# Patient Record
Sex: Male | Born: 1960 | Race: Black or African American | Hispanic: No | Marital: Single | Smoking: Current every day smoker
Health system: Southern US, Community
[De-identification: ages and names within clinical notes are randomized; demographics above are authoritative.]

## PROBLEM LIST (undated history)

## (undated) DIAGNOSIS — M1990 Unspecified osteoarthritis, unspecified site: Secondary | ICD-10-CM

## (undated) DIAGNOSIS — K219 Gastro-esophageal reflux disease without esophagitis: Secondary | ICD-10-CM

## (undated) HISTORY — PX: TOTAL KNEE ARTHROPLASTY: SHX125

## (undated) HISTORY — PX: LEG SURGERY: SHX1003

## (undated) HISTORY — PX: CARTILAGE SURGERY: SHX1303

## (undated) HISTORY — DX: Gastro-esophageal reflux disease without esophagitis: K21.9

## (undated) HISTORY — DX: Unspecified osteoarthritis, unspecified site: M19.90

## (undated) HISTORY — PX: KNEE SURGERY: SHX244

---

## 1997-10-31 ENCOUNTER — Emergency Department (HOSPITAL_COMMUNITY): Admission: EM | Admit: 1997-10-31 | Discharge: 1997-10-31 | Payer: Self-pay | Admitting: Emergency Medicine

## 1998-06-05 ENCOUNTER — Emergency Department (HOSPITAL_COMMUNITY): Admission: EM | Admit: 1998-06-05 | Discharge: 1998-06-05 | Payer: Self-pay | Admitting: Emergency Medicine

## 1999-06-03 ENCOUNTER — Emergency Department (HOSPITAL_COMMUNITY): Admission: EM | Admit: 1999-06-03 | Discharge: 1999-06-03 | Payer: Self-pay | Admitting: Emergency Medicine

## 1999-06-03 ENCOUNTER — Encounter: Payer: Self-pay | Admitting: Emergency Medicine

## 2000-08-31 ENCOUNTER — Ambulatory Visit: Admission: RE | Admit: 2000-08-31 | Discharge: 2000-08-31 | Payer: Self-pay | Admitting: Orthopedic Surgery

## 2000-08-31 ENCOUNTER — Encounter: Payer: Self-pay | Admitting: Orthopedic Surgery

## 2001-12-02 ENCOUNTER — Emergency Department (HOSPITAL_COMMUNITY): Admission: EM | Admit: 2001-12-02 | Discharge: 2001-12-02 | Payer: Self-pay | Admitting: *Deleted

## 2002-09-21 ENCOUNTER — Emergency Department (HOSPITAL_COMMUNITY): Admission: EM | Admit: 2002-09-21 | Discharge: 2002-09-21 | Payer: Self-pay | Admitting: Emergency Medicine

## 2002-12-08 ENCOUNTER — Emergency Department (HOSPITAL_COMMUNITY): Admission: EM | Admit: 2002-12-08 | Discharge: 2002-12-08 | Payer: Self-pay | Admitting: Emergency Medicine

## 2003-04-14 ENCOUNTER — Inpatient Hospital Stay (HOSPITAL_COMMUNITY): Admission: RE | Admit: 2003-04-14 | Discharge: 2003-04-17 | Payer: Self-pay | Admitting: Orthopedic Surgery

## 2003-12-13 ENCOUNTER — Emergency Department (HOSPITAL_COMMUNITY): Admission: EM | Admit: 2003-12-13 | Discharge: 2003-12-13 | Payer: Self-pay | Admitting: Emergency Medicine

## 2005-02-04 ENCOUNTER — Emergency Department (HOSPITAL_COMMUNITY): Admission: EM | Admit: 2005-02-04 | Discharge: 2005-02-04 | Payer: Self-pay | Admitting: Emergency Medicine

## 2006-05-20 ENCOUNTER — Emergency Department (HOSPITAL_COMMUNITY): Admission: EM | Admit: 2006-05-20 | Discharge: 2006-05-20 | Payer: Self-pay | Admitting: Emergency Medicine

## 2006-12-05 ENCOUNTER — Ambulatory Visit: Admission: RE | Admit: 2006-12-05 | Discharge: 2006-12-06 | Payer: Self-pay | Admitting: Specialist

## 2006-12-19 ENCOUNTER — Encounter (INDEPENDENT_AMBULATORY_CARE_PROVIDER_SITE_OTHER): Payer: Self-pay | Admitting: Specialist

## 2006-12-19 ENCOUNTER — Inpatient Hospital Stay (HOSPITAL_COMMUNITY): Admission: RE | Admit: 2006-12-19 | Discharge: 2006-12-22 | Payer: Self-pay | Admitting: Specialist

## 2007-08-07 ENCOUNTER — Emergency Department (HOSPITAL_COMMUNITY): Admission: EM | Admit: 2007-08-07 | Discharge: 2007-08-07 | Payer: Self-pay | Admitting: Emergency Medicine

## 2010-07-16 ENCOUNTER — Emergency Department (HOSPITAL_COMMUNITY): Payer: Self-pay

## 2010-07-16 ENCOUNTER — Inpatient Hospital Stay (HOSPITAL_COMMUNITY)
Admission: EM | Admit: 2010-07-16 | Discharge: 2010-07-19 | DRG: 907 | Disposition: A | Discharge status: Home / Self Care | Payer: Self-pay | Attending: General Surgery | Admitting: General Surgery

## 2010-07-16 DIAGNOSIS — S7290XA Unspecified fracture of unspecified femur, initial encounter for closed fracture: Secondary | ICD-10-CM | POA: Diagnosis present

## 2010-07-16 DIAGNOSIS — S71009A Unspecified open wound, unspecified hip, initial encounter: Principal | ICD-10-CM | POA: Diagnosis present

## 2010-07-16 DIAGNOSIS — S71109A Unspecified open wound, unspecified thigh, initial encounter: Principal | ICD-10-CM | POA: Diagnosis present

## 2010-07-16 DIAGNOSIS — Y998 Other external cause status: Secondary | ICD-10-CM

## 2010-07-16 DIAGNOSIS — Z1889 Other specified retained foreign body fragments: Secondary | ICD-10-CM

## 2010-07-16 LAB — CBC
HCT: 39.3 % (ref 39.0–52.0)
Hemoglobin: 13 g/dL (ref 13.0–17.0)
MCHC: 33.1 g/dL (ref 30.0–36.0)
RDW: 13.3 % (ref 11.5–15.5)
WBC: 7.7 10*3/uL (ref 4.0–10.5)

## 2010-07-16 LAB — POCT I-STAT, CHEM 8
Chloride: 107 meq/L (ref 96–112)
HCT: 42 % (ref 39.0–52.0)
Hemoglobin: 14.3 g/dL (ref 13.0–17.0)
Potassium: 3.8 meq/L (ref 3.5–5.1)

## 2010-07-16 LAB — PROTIME-INR
INR: 0.98 (ref 0.00–1.49)
Prothrombin Time: 13.2 s (ref 11.6–15.2)

## 2010-07-17 ENCOUNTER — Emergency Department (HOSPITAL_COMMUNITY): Payer: Self-pay

## 2010-07-17 LAB — BASIC METABOLIC PANEL
CO2: 27 mEq/L (ref 19–32)
Chloride: 103 mEq/L (ref 96–112)
Potassium: 3.8 mEq/L (ref 3.5–5.1)

## 2010-07-17 LAB — COMPREHENSIVE METABOLIC PANEL
Albumin: 3.8 g/dL (ref 3.5–5.2)
Alkaline Phosphatase: 87 U/L (ref 39–117)
BUN: 15 mg/dL (ref 6–23)
Calcium: 9.4 mg/dL (ref 8.4–10.5)
Glucose, Bld: 137 mg/dL — ABNORMAL HIGH (ref 70–99)
Potassium: 3.8 meq/L (ref 3.5–5.1)
Sodium: 140 meq/L (ref 135–145)
Total Protein: 7.4 g/dL (ref 6.0–8.3)

## 2010-07-17 LAB — TYPE AND SCREEN: Unit division: 0

## 2010-07-17 LAB — CBC
HCT: 34.1 % — ABNORMAL LOW (ref 39.0–52.0)
Hemoglobin: 11.7 g/dL — ABNORMAL LOW (ref 13.0–17.0)
MCH: 31.2 pg (ref 26.0–34.0)
MCV: 90.9 fL (ref 78.0–100.0)
RBC: 3.75 MIL/uL — ABNORMAL LOW (ref 4.22–5.81)
WBC: 13.1 10*3/uL — ABNORMAL HIGH (ref 4.0–10.5)

## 2010-07-17 LAB — LACTIC ACID, PLASMA: Lactic Acid, Venous: 3.8 mmol/L — ABNORMAL HIGH (ref 0.5–2.2)

## 2010-07-17 LAB — ABO/RH: ABO/RH(D): A POS
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
## 2010-07-20 NOTE — Op Note (Signed)
NAMESHAHZAIN, KIESTER NO.:  [id number]   MEDICAL RECORD NO.:  [id number]          PATIENT TYPE:  INP   LOCATION:  5031                         FACILITY:  [facility]   PHYSICIAN:  Kerrin Champagne, M.D.   DATE OF BIRTH:  June 03, 1960   DATE OF PROCEDURE:  12/19/2006  DATE OF DISCHARGE:                               OPERATIVE REPORT   PREOPERATIVE DIAGNOSIS:  Severe right knee tricompartment  osteoarthritis, post-traumatic.   POSTOPERATIVE DIAGNOSIS:  Severe right knee tricompartment  osteoarthritis, post-traumatic with multiple loose bodies of  osteochondral bone material and the loose osseous bodies within the  anterior aspect of the knee and posterolateral knee.  Severe joint line  erosion involving the medial joint space and five retained smooth pins,  presumably used to fix previous osteochondral injury to the medial  femoral condyle.   PROCEDURE:  Right computer-assisted cemented total knee arthroplasty  using DePuy PFC #5 femoral and tibial components with a 12.5 mm tibial  tray and 38 mm patella component.   SURGEON:  Dr. Vira Browns.   ASSISTANT:  Maud Deed, PAC.   ANESTHESIA:  General via orotracheal intubation, Dr. Krista Blue.   ESTIMATED BLOOD LOSS:  150 mL.   DRAINS:  Hemovac x1, Foley to straight drain.   TOTAL TOURNIQUET TIME:  300 mmHg for 2 hours and 25 minutes.   BRIEF CLINICAL HISTORY:  The patient is a 50 year old male who had  injured his knee as teenager falling down a hill, sustaining laceration,  underwent an arthroscopic surgery with pinning of the medial aspect of  this femur.  He has undergone progressive degeneration of his right  knee.  He has had previous left knee total knee arthroplasty by Dr.  Simonne Come in 2005.  He presents for right total knee arthroplasty after  failing conservative management.  He has enlisted in vocational  rehabilitation.  Hopes are to diminish his pain in regards to his knee  and provided him with better  ability to stand ambulate comfortably.  Intraoperative findings as above.   DESCRIPTION OF PROCEDURE:  After adequate general anesthesia, the  patient had the Foley catheter placed.  Standard preoperative  antibiotics of Ancef.  Prior to the surgery, marking of the expected  surgery knee, the right side with a surgical marking pen while the  patient was awake.  He has had a previous left knee total knee  arthroplasty.  Right knee:  Tourniquet about the upper thigh, standard  prep with DuraPrep solution from the right toes to the right upper  thigh.  He was then draped in the usual manner, iodine Vi-drape was  used.  The patient underwent an initial incision after elevation of the  right lower extremity, exsanguination with Esmarch bandage, tourniquet  was inflated to 300 mmHg.  Incision:  A standard anterior medial  incision in the midportion of the distal quad mechanism, then just  slightly medial to the midportion of the patella and along the medial  aspect of the patella tendon and anterior tibial tubercle through skin  and subcutaneous layers directly down to the peritenon layer and this  was incised  in line with the skin incision and continued along the  medial aspect of the patella tendon distally.  Peritenon elevated both  medial and lateral in order for identification and closure at the end of  the case.  Incision then made into the quad mechanism and extensor  mechanism, superior medial attachment to the patella and then continued  distally and medially about medial aspect of patella preserving a cuff  for reattachment to the patella medially and then extended along the  medial aspect of the patellar tendon directly down to bone through the  prepatellar fat pad medially.  The synovium was incised throughout its  extent along the medial incision in line with the previous incisions.  An incision proximally was continued upwards into the quadriceps tendon  medial one-third and  continued longitudinally upwards over an area of  about 10 to 11 cm above this proximal or superior pole of patella.  Following this then, the patella was everted and the knee flexed.  Leksell rongeurs were then used to debride osteophytes circumferentially  about the femoral condyles both medial lateral and over the superior  aspect of the femoral condyles anteriorly.  Incision was made into the  fat pad and subcutaneous tissues superior to the anterior aspect of the  femoral condyles proximally in order to expose the distal portions and  the anterior portion of the femoral shaft for later registration points  for cuts in the coronal plane.  Following this then, the posterior and  anterior cruciate ligaments were debrided.  The patient had exposure of  both medial aspect of proximal tibia and tibia with takedown of the pes  anserine as well as the insertion of the medial collateral ligament  medially.  This was continued posteriorly with release of the posterior  medial corner performed.  The medial meniscus was debrided using  electrocautery at the meniscocapsular junction anteriorly and medially  as best as possible.  The lateral meniscus was also excised over its  anterior portion as well as the posterior half of the retropatellar  tendon fat pad was incised.  Following this then, the patient had had  previous radiographs of his knees in the preop holding area.  He had  removal of multiple pins.  These were first localized.  Two of the pins  were quite apparent readily at the end surface of the medial femoral  condyle near the femoral notch.  These were then carefully debrided  circumferentially in bone using curettage and then removed using a small  needle-nose pliers and regular pliers.  Additional pin was then found  anterior and proximal to these pins just over the distal medial aspect  of the trochlea of the femur.  This was found by debriding cartilage  here and identifying the  cut end of the smooth pin.  Then again  debriding circumferentially about it and then grasping with the pair of  pliers and then removing.  Final pin was found just posterior on the  weightbearing surface of the medial femoral condyle closer to the  intercondylar notch and this was removed after first using osteotomes to  shave bone in order to identify its presence and then circumferentially  about the pin debriding bone material so the pin could be grasped using  a pair of pliers.  Once this was completed then, the patient had the  knee flexed further and further debridement of the intercondylar notch  was carried out of osteophytes.  The patient's leg was then brought  into  full extension and the Schanz screws were placed over the distal medial  femoral metaphysis.  The first pin was placed superior and medial aspect  of the femoral metaphysis anteriorly and then second pin place using the  pin guide.  These obtained cortical purchase both superficial and deep.  The Y array for a computer guided system was then placed on the distal  femur.  Both pins were placed within the incision line.  Distally, stab  incisions were made for the expected placement of the patient's pins and  these were approximately 15-20 cm distal to the patient's knee joint  line.  First stab incision was made in then Schanz screw passed through  the superficial medial tibial cortex obliquely engaging the deep cortex.  Then using the pin guide, a second stab incision was made and this was  used to place a second Schanz screw, again obtaining cortical purchase  in the superficial cortex and putting the deep cortex and engaging it.  The T aligned array was then placed with the tibia for communication  with the computer.  These were carefully aligned and determined in both  flexion/extension to be good position and alignment and were therefore  tightened into place using a pair of pliers.  This completed  registration  was carried out first of the right hip axis and this was  done by careful circumduction of the right hip obtaining registration of  sufficient points and computer was satisfied.  Then using the localizer,  the array device both medial and lateral malleoli were identified.  Anterior surface of the proximal tibia and both the expected medial and  lateral aspects of the proximal tibia were identified, anterior and  posterior points, degree of rotation of the tibia also identified and  registration of points within the medial tibial plateau, anterior  proximal surface of the tibia and lateral tibial plateau were performed.  Computer then developed a model for the tibia and this was then verified  checking numerous points about the proximal tibia, both medial and  lateral plateaus as well as anterior joint surface.  This completed  then, the registration then and the determination of tibial computer  model.  Next registration points for the femur were performed, first  identifying the anterior portion of the notch distal femur.  Then  determining the anterior surface of the distal femur just above the  femoral condyles.  Then both medial and lateral epicondyles were  identified.  The registration was then carried out following the  termination of Whiteside's line by of obtaining registration of the  medial femoral condyle, its most distal portions, extending posteriorly.  Then the lateral femoral condyle registration was carried out.  Registration was then carried out over the anterior distal femur and  checking the anterior bow was performed as well.  Both found to be zero.  With this then registration points had been obtained to then allow the  computer to model the distal femur and this was done without difficulty.  The tibia appeared to be a size 6 or 5.  The initial evaluation of the  knee determine the knee to be in 10 degrees of flexion and 11.6 degrees  of varus.  A significant degree  of deformity related to medial joint  line erosion and flexion deformity.  With this then determination of the  expected cut for the tibia performed a nearly 12 mm cut was necessary in  order to remove 2 mm of bone  from the medial tibial plateau surface  beneath the previous eroded surface here.  Note that osteophytes were  resected circumferentially about the proximal tibia.  The tibia was able  to be slightly subluxed protecting the soft tissue structures then the  proximal tibial cutting guide with the verification array attached was  then carefully placed and attached to the proximal tibia within 1 degree  of varus-valgus and flexion/extension and 1 mm of accuracy both medially  and laterally.  This was pinned in place using two pins medial and  lateral as well as a cross-pin medially.  With this then, the cut  surface of the proximal tibia was carried out protecting the soft tissue  structures both posteriorly, medially and laterally.  Note that several  large osteophytes were removed from the anterior aspect of the knee  joint with the initial entry into the knee joint.  At least three in  number were resected and these measured approximately 8-10 mm in  diameter each.  The proximal tibial cut was then performed using  oscillating saw protecting the patellar tendon, medial and lateral  collateral ligaments.  The proximal portion of the tibia was cut, was  then grasped and then soft tissue attachments incised and removed as a  single fragment.  The residual cruciate ligaments were then resected  posteriorly.  Any residual medial and lateral meniscal remnants were  removed as well using knives and debriding out the meniscocapsular  junction.  Next, extension and flexion gaps were measured and based on  these measurements, an expected distal femoral cut of about 12.6 mm was  determined, however, an 11.6 mm cut was chosen and a movement of the  expected position of the distal femoral  coronal and chamfer cut jig  anteriorly an additional 2 mm provided for a normalization and  realignment of the extension and flexion gaps to a more balanced knee.  With that being the case, then the patient had a #5 femoral component  chosen.  This based on the size of the component against the end of the  femur and visualization of this.  A #6 appeared to be too large for the  osteophytes still present medial and lateral.  With the T5 chosen, then  the cutting jig for the distal femoral transfer cut was then carefully  placed using the verification array and the computer guidance.  These  were pinned into place using two pins medial and lateral and a cross-  pin.  A distal cut was then performed using oscillating saw protecting  soft tissue structures medial and lateral.  This was then verified and  further trimmed until it was within 1 mm cut as well as within 1 degree  of varus valgus flexion extension.  This completed then, the jig for the  distal femoral cut was removed as were the proximal tibial pins.  The  patient had further soft tissue release and check of both the extension  and flexion gap was performed and these were determined to be in good  position and alignment.  That completed, then the tibia was subluxed,  McHales were used for this purpose.  The patient had further resection  of osteophytes off the posterior aspect of the femoral condyles both  medial and lateral using a 1-inch curved osteotome.  Large Crego to  protect soft tissue structures here.  Very large osteophytes off the  posterior aspect of the lateral proximal tibia were resected further and  the pinned over the  posterior aspect of the proximal tibia was  identified on the cut surface of the proximal tibia removed.  Next the  tibial tray was placed.  A #5 tray was placed onto the cut surface of  the proximal tibia, pinned into place.  The upright for the reamer for  the tibial keel was then impacted into  place.  The reamer for reaming  for the keel was then reverse spinned and was used to then ream for the  keel itself.  This was then removed.  The winged flange for the keel  along with the keel insertion device also was then used and impacted  into place through the tray proximally.  This being completed, then  attention was turned to the femoral distal cut surfaces where both coronal cuts and chamfer cuts had been performed.  Chamfer cuts  performed after performing both coronal anterior cut protecting soft  tissue structures and the posterior condyle coronal cuts using Crego to  protect soft tissues here.  The cutting jig was removed appropriately as  well.  A jig used for cutting of the box for the distal portion of the  femur was then placed through the end of the cut surface of the femur  and aligned with the anterior cut surface as well.  Pinned with three  pins.  This in place, then an oscillating saw was then used to cut the  transverse cut at the very top the notch.  The small saw was left in  place then using a larger oscillating saw, cuts were made then the  sagittal plane removing the sagittal bone for the femoral notch  distally.  Care was taken to carefully file the ends of the bones here  to smooth this. This cutting block was then removed.  The trial femur  was inserted and packed into place without difficulty.  Provided  excellent fit.  Trial reduction first a 10 mm then the 12.5 provided  excellent fit with excellent varus and valgus stability, flexion to over  120 degrees, nearly 135 degrees obtained.  The shuck test demonstrated  no evidence of shucking of the right knee with this component.  This was  chosen for the right total knee arthroplasty.  The cutting jig for the  coronal cut for the posterior aspect of the patella was then brought  onto the field with the patella measured at 27 mm with a 10 mm resection  and 17 mm patella remaining anteriorly.  The cutting  jig was then  carefully adjusted to allow for 17 mm remaining then applied to the  patella.  Oscillating saw was then passed through the cutting jig used  to cut the patella in the coronal plane.  Next, a 38 mm patella was  chosen based on size of the cut surface of patella.  This provided the  best bit.  It was medialized slightly and impacted into place and then  drill holes were placed for the expected pegs for the posterior patellar  resurfacing.  Next, then irrigation was carried out.  Care was taken to  ensure that all osteophytes had been resected.  The knee had been placed  through a full range of motion with full extension and full flexion,  stable varus and valgus stress, no tendencies to patella dislocation nor  any tendencies for lift-off of the proximal tibial component.  All the  trial components were then removed and the permanent prosthesis brought  onto the field, both the distal  femur and proximal tibia with its keel  as well as the retropatellar prosthesis.  Right knee was then irrigated  with copious amounts of irrigant solution using pulse lavage system.  Then new drapes were applied.  Cement was then mixed to its proper  consistency.  After drying of the proximal tibia, it was applied to the  keel hole as well as the proximal cut surface of the tibia in a thin  layer.  Additional cement was placed over the deep surface of the tibia.  The tibia component was then placed over the proximal tibia and impacted  into place.  Excess cement circumferentially was resected and removed  without difficulty.  Next the distal cut surface of the femur was then  coated with cement.  Note that the lug holes were drilled through the  trial prosthesis holes available for the lug nut drills.  Cement was  then applied to the cut surfaces and the holes for the distal femur.  Small amount of the notch over the anterior surface of the distal tibia.  Additional cement was placed over the  posterior runners of the femoral  component.  It was then placed over the distal tibia, engaging the  distal lug holes with lugs.  Then impacted into place.  Excellent  position and alignment was obtained.  Excess cement was removed using  Personal assistant provided.  This completed, then the trial 12.5-mm  implant was placed into the tibial keel, leg brought into full  extension.  Patella component was then coated with cement over its  posterior aspect.  Additional cement was placed over the retropatellar  surface following drying of this area and debridement of the holes for  the pegs of the anterior aspect the time of the patella component.  Following application of cement then a Glorious Peach was used to note the hole  positions.  Component was brought down to the retropatellar surface and  pressed into place.  Excess cement was removed and then ring compressing  tong was then used to carefully apply pressure to the patella, allowing  for cement then to be squeezed out from the surface of the component  with bone.  Excess cement removed using first a knife and then [employer].  With this, then leg was irrigated.  Tourniquet was released.  Tourniquet time was 2 hours and 25 minutes.  Following released then  hemostasis obtained in the superficial layers of skin and subcu and then  examining for geniculate vessel bleeding.  These were cauterized both  medial and lateral and also over the central perforating vessels  posteriorly.  This completed, trial reduction was performed using both a  10 mm and 12.5 mm components.  The 12.5 mm provided the best  medial  varus and valgus stress stability as well as decreased shuck sign.  The  knee could be brought into full extension, hyperextension 2-3 degrees  and into varus neutral alignment 1 degree of varus.  With maximum of 4  degrees of varus without stressing medial joint line.  Once the cement  had completely hardened, irrigation was performed of  the knee.  Knee was  hyperflexed and the tibial tray was removed, the trial trays.  Examination for excess cement, this demonstrated some small amounts over  the posterior aspect of the tibial tray and about the femoral component  medial and lateral, these were removed using osteotomes.  Small amount  about the patella surface as well removed with a quarter inch  osteotome.  Irrigation performed.  The permanent tibial tray was then inserted  carefully retracting subcu and soft tissues away from the proximal tibia  and using McHales provided as well as the small Homan to retract soft  tissue.  This was done without difficulty.  The knee was then reduced  and the patella component engaged the distal femur femoral trochlea.  The knee was placed through a full range of motion, full extension and  indeed hyperextension of about 3 degrees, flexion to 135 degrees.  The  knee was stable to varus and valgus stress, no significant shuck noted  on the anterior drawer.  This completed, then a medium Hemovac drain was  placed in the depth of the incision exiting over the anterior lateral  aspect of the distal thigh.  This just short of the joint surface line.  Knee brought into full extension and there was no further active  bleeding present.  Hemostasis had been complete and then the synovium  was approximated with interrupted 0 Vicryl sutures.  The extensor  mechanism of the distal thigh approximated with interrupted #1 Vicryl  sutures along with the insertion into the patella and superior medial  corner with #1 Vicryl sutures along the medial aspect of patella and along the medial aspect of the patellar tendon.  The peritenon was then  approximated with interrupted 0 Vicryl sutures, providing one tight  closure.  Subcutaneous layers approximated properly with interrupted 0  Vicryl sutures and distally with interrupted 2-0 Vicryl sutures.  The  skin closed with a running subcu stitch of 4-0 Vicryl.   Tincture of  benzoin and Steri-Strips applied, 4x4s, ABD pad fixed to the skin with  Kerlix, then Ace wrap applied from right foot to upper thigh, knee  immobilizer was applied.  The patient was then reactivated, extubated,  returned to the recovery room in satisfactory condition.      Kerrin Champagne, M.D.  Electronically Signed     JEN/MEDQ  D:  12/19/2006  T:  12/20/2006  Job:  [id number]

## 2010-07-23 NOTE — H&P (Signed)
[place]  Patient:    Derek Duran, Derek Duran                     MRN: [id number] Adm. Date:  09/05/00 Attending:  Fayrene Fearing P. Aplington, M.D. Dictator:   Colleen P. Mahar, P.A.                         History and Physical  DATE OF BIRTH:  May 25, 1960  ADDENDUM  On August 29, 2000 I spoke with Dr. Michelle Piper in the Anesthesia Department at [facility] regarding this patient and his history of daily crack and marijuana abuse.  Dr. Michelle Piper indicated that as long as the patient was able to stop using these drugs on the date he indicated that it should not be a problem with anesthesia and they would be willing to do the surgery.  I had spoken with the patient during his history and physical on August 29, 1999, Monday, regarding discontinuing any abuse of these drugs at least through the date of surgery and during his recovery from surgery and he indicated that he would stop as of Monday, August 28, 2000 using any drugs.  The patient also indicated that he would be interested in trying to get into some sort of a rehabilitation program after the surgery in order to keep himself off drugs postoperatively and throughout his recovery process. DD:  08/30/00 TD:  08/30/00 Job: [id number]

## 2010-07-23 NOTE — Op Note (Signed)
NAME:  Derek Duran, Derek Duran                      ACCOUNT NO.:  [id number]   MEDICAL RECORD NO.:  [id number]                   PATIENT TYPE:  INP   LOCATION:  X009                                 FACILITY:  [facility]   PHYSICIAN:  Marlowe Kays, M.D.               DATE OF BIRTH:  October 23, 1960   DATE OF PROCEDURE:  04/14/2003  DATE OF DISCHARGE:                                 OPERATIVE REPORT   PREOPERATIVE DIAGNOSIS:  Osteoarthritis, left knee.   POSTOPERATIVE DIAGNOSIS:  Osteoarthritis, left knee.   OPERATION:  Osteonics total knee replacement, left.   SURGEON:  Illene Labrador. Aplington, M.D.   ASSISTANT:  Georges Lynch. Darrelyn Hillock, M.D.   ANESTHESIA:  General.   PATHOLOGY AND JUSTIFICATION FOR PROCEDURE:  Long history of bilateral knee  problems and surgery.  Both knees have advanced osteoarthritis.  In the left  knee, is large loose spot in the anterior knee, taking origin from the  lateral femoral condyle and tricompartmental arthritic changes.  This one is  more symptomatic than the right, and hence we are doing this one first.   PROCEDURE:  Prophylactic antibiotics, satisfactory general anesthesia,  pneumatic tourniquet, Surefoot, lateral hip positioner.  Left leg was  prepped with DuraPrep, draped in a sterile field, Ioban employed, this  marked out sterilely.  Vertical incision down to the patella mechanism with  median parapatellar incision to open the joint.  The patient's and medial  collateral ligament were freed up over the proximal tibia.  Patellar  mechanism was freed up, patella everted, and knee flexed.  Osteophytes from  around the femur and the patella were removed.  Remnants of the ACL, the  loose body, and the anterior portions of the both menisci were removed.  I  made a drill in the distal femur, followed by the axis liner set for 5  degrees for the let knee and made a 10 mm distal femoral cut since he did  not have a flexion contracture, and his knee was so tight that I  was unable  to place the cradle for sizing the distal femur without flattening out the  distal femur on initial leveling cut which we would normally make later.  After making the leveling cut, we were able to size the distal femur to 9.  Scribe lines were placed on the distal femur, and then we used the distal  femoral cutting jig to make anterior and posterior cuts of posterior and  anterior chamferings.  Then used a micro saw to further remove bone in the  intercondylar notch since he had such hard bone to minimize risk of cracking  the bone during the impaction technique.  I then used the jig for cutting  the patellar groove followed by the two jigs for creating the space for the  tibial post.  Following this, I returned to the tibia where we sized him at  a 9 and placed the baseplate using  both initial drill and step-cut drill.  Then using intramedullary rod and external cutting jig set for 0-degree cut,  I made a 4 mm cut based off the compressed medial tibial plateau.  Then used  the lamina spreader and removed remnants of bone from underneath the femoral  condyles. The lateral was deficient and had articular cartilage present  which I curetted off down to raw bone.  Then went through a trial reduction,  found the 10 mm spacer seemed to fit nicely.  With the knee in extension, we  then used the 10 mm recessed cutting guide for a 28 mm cut.  Trial  prosthesis was then placed and excess bone trimmed up from around the  perimeter.  We then took the knee through a full range of motion and using  the external rod, splitting the bimalleolar distance, positioned the tibial  tray in the ideal position and marked it at the scribe lines at that  position.  With the knee flexed, we then used the tripod apparatus to ream  for the tibial keel up to a #9 cemented.  The knee was then waterpicked.  Gelfoam was placed in the femoral canal as a spacer, and methacrylate was  placed first on the tibia,  followed by the tibia component, impacting it  tightly.  We then placed it on the femur, building up the slight area of the  posterior femur where he had bone deficiency.  The major portion of the  prosthesis fit on good solid foundation bone.  Then with the knee in  extension, after impacting and removing excess methacrylate around the  femur, we inserted our patellar component and held it with a clamp with  excess methyl methacrylate being removed from there as well.  When the  methacrylate had hardened, we checked and removed small amounts of  methacrylate throughout the knee and went through another trial reduction  and found that a 10 posterior stabilized insert was the ideal.  The final  flex 10 mm insert was then placed and the knee taken through a full range of  motion with excellent stability.  There was no patella release required.  A  Hemovac was placed and the wound closed with interrupted #1 Vicryl in the  quadriceps and two layers and distally in two layers in the synovium and  capsule, subcutaneous tissue with a combination of 0 and 2-0 Vicryl, skin  with staples.  Betadine, Adaptic dry sterile dressing were applied.  He was  placed in knee immobilizer and taken to the recovery room in satisfactory  condition.  Tourniquet time was 2 hours.  No blood loss.  No blood  replacement.                                               Marlowe Kays, M.D.    JA/MEDQ  D:  04/14/2003  T:  04/14/2003  Job:  [id number]

## 2010-07-23 NOTE — Discharge Summary (Signed)
Derek Duran NO.:  [id number]   MEDICAL RECORD NO.:  [id number]          PATIENT TYPE:  INP   LOCATION:  5031                         FACILITY:  [facility]   PHYSICIAN:  Derek Duran, M.D.   DATE OF BIRTH:  29-Aug-1960   DATE OF ADMISSION:  12/19/2006  DATE OF DISCHARGE:  12/22/2006                               DISCHARGE SUMMARY   ADMISSION DIAGNOSES:  1. Severe osteoarthritis of the right knee.  2. History of drug abuse.   DISCHARGE DIAGNOSIS:  1. Severe osteoarthritis of the right knee.  2. History of drug abuse.  3. Posthemorrhagic anemia.   PROCEDURE:  On December 19, 2006, the patient underwent right computer-  assisted cemented total knee arthroplasty performed by Dr. Otelia Duran,  assisted by Derek Duran, [facility], under general anesthesia.   CONSULTATIONS:  None.   BRIEF HISTORY:  Patient is a 51 year old male who sustained an injury to  his right knee in his early teens.  He underwent arthroscopic surgery  with pinning of the medial aspect of the femur.  Through the years, he  has progressed to have degeneration of the right knee joint.  He has had  a previous knee replacement on the left in 2005.  He did well following  this procedure.  Now he would like to have the right knee replaced as he  is failing conservative management.  Radiographs have shown severe right  knee tricompartmental osteoarthritis with severe joint line erosion  involving the medial joint space and several loose osseous bodies within  the knee.  It was felt he would benefit from a knee replacement and was  admitted for the procedure as stated above.   BRIEF HOSPITAL COURSE:  The patient tolerated the procedure under  general anesthesia without complications.  He was placed on Coumadin  postoperatively for DVT and PE prophylaxes.  Adjustments in Coumadin  made according to daily pro times by the pharmacist at [facility]. At  discharge, the patient's INR was therapeutic.  The  patient was treated  with PCA analgesics initially and weaned to p.o. analgesics without  difficulty.  The patient's Hemovac drain was discontinued through the  evening hours.  Wound was checked daily thereafter.  He was noted to  have good healing of his wound without drainage.   The patient developed postoperative anemia not requiring blood  transfusion.  Hemoglobin and hematocrit were stable at discharge at 10.4  and 30.0, respectively.  The patient received physical therapy for  ambulation and gait training.  He was able to ambulate as much as 300  feet during the hospital stay.  He was allowed weightbearing as  tolerated on the operative extremity.  He was also taught range of  motion and strengthening exercises which were done daily.  CPM was  utilized during the hospital stay.  The patient was able to resume a  regular diet.  He was voiding well after his catheter was discontinued.  The patient received occupational therapy for ADLs.  He was stable for  discharge on December 22, 2006.   PERTINENT LABORATORY VALUES:  CBC on admission:  Hemoglobin 12.5,  hematocrit 36.1.  At discharge, hemoglobin and hematocrit stable at 10.4  and 30.0, respectively.  At discharge, INR 1.4.  Chemistry studies on  admission within normal limits and repeat postoperatively with values  normal as well.  Urinalysis on admission negative for urinary tract  infection.  Urine drug screen also negative.   PLAN:  The patient was discharged home with arrangements made for home  health physical therapy and occupational therapy.  He will continue with  ambulation and gait training, range of motion strengthening and  stretching exercises.  Daily dressing change at home.   1. He will continue on Coumadin; 7.5 mg was his discharge dose with      arrangements for home labs to be performed.  2. He was given OxyContin 20 mg one every 12 hours.  3. Percocet 5/325 one to two every 4-6 hours as needed for       breakthrough pain.  4. Robaxin 500 mg one every 8 hours as needed for spasm.   He will continue on a regular diet.  Follow up with Dr. Otelia Duran 2 weeks  from the date of surgery.  All questions encouraged and answered.   CONDITION ON DISCHARGE:  Stable.      Derek Duran, P.A.      Derek Duran, M.D.  Electronically Signed    SMV/MEDQ  D:  03/13/2007  T:  03/13/2007  Job:  [id number]

## 2010-07-23 NOTE — H&P (Signed)
[place]  Patient:    Derek Duran, Derek Duran                     MRN: [id number] Adm. Date:  09/05/00 Attending:  Fayrene Fearing P. Aplington, M.D. Dictator:   Colleen P. Mahar, P.A.-C.                         History and Physical  DATE OF BIRTH:  09/13/1960  CHIEF COMPLAINT:  Right knee pain.  HISTORY OF PRESENT ILLNESS:  The patient has had a long history of problems with his right knee.  This began he said over 20 years ago with some injuries secondary to an accident as well as playing sports.  He has had a number of surgeries.  It is unsure exactly what he has had done.  He thinks he might have had an ACL repair in the past and some other surgeries to that right knee.  He has had an increase in pain and disability over the past few years and now is significantly limited secondary to pain.  He has difficulty with ambulation and activities of daily living.  He has tried conservative management and failed that.  His knee is to the point that it is interfering with activities of daily living and the best option for him at this point is a total knee replacement.  PAST MEDICAL HISTORY:  The patient stated that he is healthy and does not have any medical conditions that he is followed for.  PAST SURGICAL HISTORY:  He has had three surgeries on his right knee.  He is unable to be more specific than that as to what was done with those surgeries.  ALLERGIES:  No known drug allergies.  MEDICATIONS:  Dextra 10 mg one q.d.  PRIMARY CARE PRACTITIONER:  He does not have one.  He does not ever get sick he said.  SOCIAL HISTORY:  He smokes approximately 1/2 pack of cigarettes per day.  He does drink alcohol and drinks approximately six drinks per day stating that he will drink beer, wine, liquor or whatever he has.  He does admit to using crack on a regular basis.  He says this is almost daily.  He almost admits to using marijuana on a daily basis.  He lives in  [place] with his girlfriend of 26 years and he also has a 64 year old daughter who lives at home with him.  FAMILY MEDICAL HISTORY:  His parents are both still alive and in good health to the best of his knowledge.  He thinks his father may have hypertension.  REVIEW OF SYSTEMS:  The patient denies fever, chills, night sweats or bleeding tendencies.  He denies blurred vision, double vision, seizures, headaches or paralysis.  Denies shortness of breath, productive cough or hemoptysis. Denies chest pain, angina, orthopnea or claudications.  Denies nausea, vomiting, constipation, diarrhea, melena or bloody stool.  Denies dysuria, hematuria or discharge.  Denies extremity paresthesia, numbness or weakness.  PHYSICAL EXAMINATION:  VITAL SIGNS:  Blood pressure 110/68, respirations are 14 and unlabored. Pulse is 76 and regular.  GENERAL:  This is a 50 year old African-American male who is alert and oriented in no acute distress.  He is pleasant and cooperative to examination.  HEENT:  Head is normocephalic, atraumatic.  Pupils are equal, round and reactive to light and accommodation.  Extraocular movements are intact.  NECK:  Supple to palpation.  No bruits appreciated.  No thyromegaly  noted.  CHEST:  Clear to auscultation bilaterally anterior and posterior.  No rales, rhonchi, wheezes, stridor or friction rubs appreciated.  BREASTS:  Not pertinent and not examined.  HEART:  Regular S1 and S2, regular rate and rhythm.  No murmurs, gallops or rubs appreciated.  ABDOMEN:  Positive bowel sounds throughout.  Soft and supple to palpation. Nondistended and nontender.  No organomegaly is noted.  GENITOURINARY:  Not pertinent and not examined.  EXTREMITIES:  There is significant thigh atrophy on his right leg and he has difficulty extending his right knee with a 10-15 degree short of full extension.  SKIN:  Intact without rashes or lesions.  X-RAYS:  X-rays from [facility]  back in 2001 revealed multiple pins in the patients femur and one in the proximal tibia.  On the lateral, there is one pin in the posterior recess of the tibia though suspected to come out of the femur and lodged in that location.  There is also large loose body between the femur and tibia anteriorly.  IMPRESSION: 1. Right knee osteoarthritis. 2. 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  PLAN: 1. Admit to [facility] on 09/05/00 for right total knee    replacement by Dr. Marlowe Kays. 2. I spoke with Dr. Simonne Come regarding this patient and his substance    abuse.  The patient indicated that he would stop using both crack and    marijuana from this point forward at least until surgical date.  With    this information Dr. Simonne Come said that if anesthesia is comfortable    during the procedure, that he will go ahead and do the procedure. 3. I did speak with the patient regarding rehabilitation and if he would    be interested in going into a rehabilitation program to help him with his    substance abuse.  He demonstrated that he was eager to discontinue using    drugs and that he would be very interested in a rehabilitation program    either prior or after surgery if surgery does go ahead as scheduled    next week.  We can make the social workers aware of his situation and    see if they can put him in touch with the proper people to get this    type help. DD:  08/29/00 TD:  08/29/00 Job: [id number]

## 2010-07-23 NOTE — H&P (Signed)
NAME:  Derek Duran, Derek Duran                      ACCOUNT NO.:  [id number]   MEDICAL RECORD NO.:  [id number]                   PATIENT TYPE:  INP   LOCATION:  NA                                   FACILITY:  [facility]   PHYSICIAN:  Marlowe Kays, M.D.               DATE OF BIRTH:  1960-04-13   DATE OF ADMISSION:  DATE OF DISCHARGE:                                HISTORY & PHYSICAL   CHIEF COMPLAINT:  Pain in my knees, more so on the left than on the right.   HISTORY OF PRESENT ILLNESS:  This 50 year old black male has been seen by [name]  pertaining to progressive problems concerning degenerative changes in both  knees.  He has had problems with both of his knees all of his life,  recalling surgeries and injuries back to his teenage years.  He has been  scheduled for surgeries in the past; however, he was having extreme  difficulty staying away from illicit substances.  He now states that he is  straight.  He is anxious to go ahead with the surgery.  The patient has  had recent dental work, and the majority of the problems with his teeth have  been taken care of.  We will certainly use perioperative antibiotics during  this patient's surgical procedure.  X-rays of his knees show bone-on-bone  abutment medially and a large fragment in the joint on the left knee.  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  He has attempt to continue  with his work; however, both of his knees swell considerably and has  difficulty standing for long periods of time, which his work requires.  After much consideration and discussion of the postoperative problems that  can occur with surgery, the patient has agreed to go ahead with total knee  replacement and arthroplasty of the left knee.   PAST MEDICAL HISTORY:  Significant only for the surgeries on both of his  knees in the past.  Surgeries were done in 1978, 1980, and a knee scope in  1981.  Patient does have a history of drug and alcohol abuse in  the past;  however, as mentioned above, he is clean.   SOCIAL HISTORY:  Patient is single.  He is a Scientist, research (medical).  He smokes 10-15  cigarettes a day.  He has occasional intake of ETOH at this time.  He has  one child.  His niece and girlfriend will be caregivers after surgery.  We  plan to use [facility] health.   FAMILY HISTORY:  Noncontributory.   REVIEW OF SYSTEMS:  CNS:  No seizure disorder problems, numbness, double  vision.  RESPIRATORY:  No productive cough.  No hemoptysis.  No shortness of  breath.  CARDIOVASCULAR:  No chest pain.  No angina.  No orthopnea.  GASTROINTESTINAL:  No nausea, vomiting, melena, bloody stools.  GENITOURINARY:  No discharge, dysuria, or hematuria.  MUSCULOSKELETAL:  Primarily in present illness.   PHYSICAL EXAMINATION:  VITAL SIGNS:  Blood pressure 124/78, pulse 80,  respirations 12.  GENERAL:  He is a alert, cooperative, and friendly 50 year old black male.  HEENT:  Normocephalic and atraumatic.  PERRLA.  EOMs are intact.  Oropharynx  is clear with fair dental hygiene.  NECK:  Supple.  No lymphadenopathy.  LUNGS:  Clear to auscultation.  No rales or rhonchi.  HEART:  Regular rate and rhythm.  No murmurs are heard.  ABDOMEN:  Soft and nontender.  Liver and spleen not felt.  RECTAL/GENITOURINARY:  Not done.  EXTREMITIES:  Patient has a mild amount of effusion of the left knee with  painful range of motion.   ADMISSION DIAGNOSES:  1. Severe, degenerative changes to both knees, more so the left than on the     right.  2. History of substance abuse.   PLAN:  Patient will undergo total knee replacement with arthroplasty of the  left knee with home health after surgery.  He does not have a family  physician.  He is admonished to not take the niacin that he has been taking  to cleanse his blood.  Also to avoid alcohol or any other controlled  substances.     Dooley L. Cherlynn June.                 Marlowe Kays, M.D.    DLU/MEDQ   D:  04/08/2003  T:  04/08/2003  Job:  [id number]

## 2010-07-23 NOTE — Discharge Summary (Signed)
NAME:  Derek Duran, Derek Duran                      ACCOUNT NO.:  [id number]   MEDICAL RECORD NO.:  [id number]                   PATIENT TYPE:  INP   LOCATION:  0478                                 [facility]ACILITY[facility]   PHYSICIAN:  Marlowe Kays, M.D.               DATE OF BIRTH:  11/30/1960   DATE OF ADMISSION:  04/14/2003  DATE OF DISCHARGE:  04/17/2003                                 DISCHARGE SUMMARY   ADMISSION DIAGNOSES:  Severe degenerative arthritis of the left knee.   DISCHARGE DIAGNOSES:  1. Severe degenerative arthritis of the left knee.  2. Mild postoperative anemia.   OPERATION:  On April 14, 2003, the patient underwent [employer]onics total knee  replacement arthroplasty of the left knee, Dr. Ranee Gosselin assisted.   BRIEF HISTORY:  This 50 year old black male has progressive problems  concerning osteoarthritis in both his knees more so on the left than the  right.  He has had a severe amount of trauma throughout his lifetime to this  knee on the left and has had knee scope in the past.  He is a cook at [employer] and after standing for long periods of time he finds that his  pain and discomfort markedly interfering with both his professional and  personal life. After x-rays showing degenerative changes and after much  discussion, it was decided he would benefit from surgical intervention and  was admitted for the above procedure.   HOSPITAL COURSE:  The patient tolerated the surgical procedure quite well.  He entered eagerly into the total knee protocol.  We were able to wean him  from PCA to p.o. medications.  Neurovascularly intact to the left lower  extremity, the wound was dry.  We emphasized extension as well as flexion of  the knee and he achieved 55 degrees on CPM machine. He was ambulating in the  hall doing quite well, afebrile and he had some mild anemia with a  hemoglobin of 9.2, hematocrit of 27.0.  He was asymptomatic.  He was very  eager to go home  the day of discharge, wound was inspected and was dry,  neurovascularly intact to the left lower extremity.  Home health with  [facility] was arranged and so we decided to go ahead and discharge him home to  return to see Marlowe Kays, M.D. in approximately two weeks.   Laboratory values in the hospital hematologically showed a preoperative CBC  completely within normal limits as well as normal electrolytes and  urinalysis negative. Final hemoglobin was 9.2.   CONDITION ON DISCHARGE:  Improved, stable.   PLAN:  The patient is discharged to his home to continue his home  medications and diet. He is to have home health with Genevieve Norlander progressing  with range of motion to his left knee.  He may shower the fourth day after  surgery, return to see Dr. Simonne Come two weeks after date of surgery.  DISCHARGE MEDICATIONS:  1. Percocet 5/325, #50, 1-2 q. 4-6h. p.r.n. pain.  2. Robaxin 500 mg, #30 with 2 refills, 1 q. 6h. p.r.n. muscle spasm.  3. Trinsicon #60, 1 b.i.d.  4. Coumadin per pharmacy.   He is to call if any problems.     Dooley L. Cherlynn June.                 Marlowe Kays, M.D.    DLU/MEDQ  D:  04/17/2003  T:  04/17/2003  Job:  [id number]

## 2010-08-12 ENCOUNTER — Ambulatory Visit: Payer: Self-pay | Attending: Orthopaedic Surgery | Admitting: Rehabilitative and Restorative Service Providers"

## 2010-08-12 DIAGNOSIS — R262 Difficulty in walking, not elsewhere classified: Secondary | ICD-10-CM | POA: Insufficient documentation

## 2010-08-12 DIAGNOSIS — M2560 Stiffness of unspecified joint, not elsewhere classified: Secondary | ICD-10-CM | POA: Insufficient documentation

## 2010-08-12 DIAGNOSIS — Z96659 Presence of unspecified artificial knee joint: Secondary | ICD-10-CM | POA: Insufficient documentation

## 2010-08-12 DIAGNOSIS — M6281 Muscle weakness (generalized): Secondary | ICD-10-CM | POA: Insufficient documentation

## 2010-08-12 DIAGNOSIS — IMO0001 Reserved for inherently not codable concepts without codable children: Secondary | ICD-10-CM | POA: Insufficient documentation

## 2010-08-19 ENCOUNTER — Ambulatory Visit: Payer: Self-pay | Admitting: Rehabilitative and Restorative Service Providers"

## 2010-08-24 ENCOUNTER — Ambulatory Visit: Payer: Self-pay | Admitting: Rehabilitative and Restorative Service Providers"

## 2010-08-25 ENCOUNTER — Ambulatory Visit: Payer: Self-pay | Admitting: Rehabilitative and Restorative Service Providers"

## 2010-08-30 ENCOUNTER — Ambulatory Visit: Payer: Self-pay

## 2010-09-01 ENCOUNTER — Ambulatory Visit: Payer: Self-pay
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
## 2010-09-06 ENCOUNTER — Ambulatory Visit: Payer: Self-pay | Attending: Orthopaedic Surgery | Admitting: Rehabilitative and Restorative Service Providers"

## 2010-09-06 ENCOUNTER — Encounter: Payer: Self-pay | Admitting: Physical Therapy

## 2010-09-06 DIAGNOSIS — M2560 Stiffness of unspecified joint, not elsewhere classified: Secondary | ICD-10-CM | POA: Insufficient documentation

## 2010-09-06 DIAGNOSIS — Z96659 Presence of unspecified artificial knee joint: Secondary | ICD-10-CM | POA: Insufficient documentation

## 2010-09-06 DIAGNOSIS — M6281 Muscle weakness (generalized): Secondary | ICD-10-CM | POA: Insufficient documentation

## 2010-09-06 DIAGNOSIS — IMO0001 Reserved for inherently not codable concepts without codable children: Secondary | ICD-10-CM | POA: Insufficient documentation

## 2010-09-06 DIAGNOSIS — R262 Difficulty in walking, not elsewhere classified: Secondary | ICD-10-CM | POA: Insufficient documentation

## 2010-09-13 ENCOUNTER — Ambulatory Visit: Payer: Self-pay | Admitting: Physical Therapy

## 2010-09-15 ENCOUNTER — Ambulatory Visit: Payer: Self-pay | Admitting: Physical Therapy

## 2010-09-17 ENCOUNTER — Ambulatory Visit: Payer: Self-pay | Admitting: Physical Therapy

## 2010-09-28 ENCOUNTER — Ambulatory Visit: Payer: Self-pay | Admitting: Rehabilitative and Restorative Service Providers"

## 2010-09-30 ENCOUNTER — Ambulatory Visit: Payer: Self-pay | Admitting: Rehabilitative and Restorative Service Providers"

## 2010-10-07 ENCOUNTER — Ambulatory Visit: Payer: Self-pay | Attending: Orthopaedic Surgery | Admitting: Physical Therapy

## 2010-10-07 DIAGNOSIS — R262 Difficulty in walking, not elsewhere classified: Secondary | ICD-10-CM | POA: Insufficient documentation

## 2010-10-07 DIAGNOSIS — Z96659 Presence of unspecified artificial knee joint: Secondary | ICD-10-CM | POA: Insufficient documentation

## 2010-10-07 DIAGNOSIS — M2560 Stiffness of unspecified joint, not elsewhere classified: Secondary | ICD-10-CM | POA: Insufficient documentation

## 2010-10-07 DIAGNOSIS — M6281 Muscle weakness (generalized): Secondary | ICD-10-CM | POA: Insufficient documentation

## 2010-10-07 DIAGNOSIS — IMO0001 Reserved for inherently not codable concepts without codable children: Secondary | ICD-10-CM | POA: Insufficient documentation

## 2010-10-08 ENCOUNTER — Ambulatory Visit: Payer: Self-pay | Admitting: Physical Therapy

## 2010-10-12 ENCOUNTER — Ambulatory Visit: Payer: Self-pay | Admitting: Physical Therapy

## 2010-10-14 ENCOUNTER — Ambulatory Visit: Payer: Self-pay | Admitting: Physical Therapy

## 2010-10-19 ENCOUNTER — Encounter: Payer: Self-pay | Admitting: Rehabilitative and Restorative Service Providers"

## 2010-10-21 ENCOUNTER — Ambulatory Visit: Payer: Self-pay | Admitting: Rehabilitative and Restorative Service Providers"

## 2010-11-01 ENCOUNTER — Ambulatory Visit: Payer: Self-pay | Admitting: Physical Therapy

## 2010-11-03 ENCOUNTER — Ambulatory Visit: Payer: Self-pay | Admitting: Physical Therapy

## 2010-11-09 ENCOUNTER — Encounter: Payer: Self-pay | Admitting: Physical Therapy

## 2010-11-11 ENCOUNTER — Ambulatory Visit: Payer: Self-pay | Attending: Orthopaedic Surgery | Admitting: Rehabilitative and Restorative Service Providers"

## 2010-11-11 DIAGNOSIS — IMO0001 Reserved for inherently not codable concepts without codable children: Secondary | ICD-10-CM | POA: Insufficient documentation

## 2010-11-11 DIAGNOSIS — R262 Difficulty in walking, not elsewhere classified: Secondary | ICD-10-CM | POA: Insufficient documentation

## 2010-11-11 DIAGNOSIS — M2560 Stiffness of unspecified joint, not elsewhere classified: Secondary | ICD-10-CM | POA: Insufficient documentation

## 2010-11-11 DIAGNOSIS — Z96659 Presence of unspecified artificial knee joint: Secondary | ICD-10-CM | POA: Insufficient documentation

## 2010-11-11 DIAGNOSIS — M6281 Muscle weakness (generalized): Secondary | ICD-10-CM | POA: Insufficient documentation

## 2010-12-15 LAB — CBC
HCT: 31.3 — ABNORMAL LOW
Hemoglobin: 10.4 — ABNORMAL LOW
Hemoglobin: 10.4 — ABNORMAL LOW
Hemoglobin: 10.8 — ABNORMAL LOW
MCHC: 33.9
MCV: 90.2
MCV: 91.2
Platelets: 300
RBC: 3.29 — ABNORMAL LOW
RBC: 3.32 — ABNORMAL LOW
RDW: 12.9
RDW: 13.3

## 2010-12-15 LAB — BASIC METABOLIC PANEL
Calcium: 8.6
Chloride: 101
GFR calc non Af Amer: >60
Potassium: 3.7

## 2010-12-15 LAB — PROTIME-INR
INR: 1.4
Prothrombin Time: 17.2 — ABNORMAL HIGH

## 2010-12-16 LAB — RAPID URINE DRUG SCREEN, HOSP PERFORMED
Barbiturates: NOT DETECTED
Barbiturates: NOT DETECTED
Benzodiazepines: NOT DETECTED
Benzodiazepines: NOT DETECTED
Cocaine: NOT DETECTED
Opiates: NOT DETECTED
Tetrahydrocannabinol: NOT DETECTED
Tetrahydrocannabinol: NOT DETECTED

## 2010-12-16 LAB — TYPE AND SCREEN: Antibody Screen: NEGATIVE

## 2010-12-16 LAB — CBC
Hemoglobin: 12.9 — ABNORMAL LOW
MCHC: 34.6
MCV: 90.3
Platelets: 386
RDW: 13
WBC: 6.1

## 2010-12-16 LAB — BASIC METABOLIC PANEL
CO2: 26
Glucose, Bld: 119 — ABNORMAL HIGH
Potassium: 3.9
Sodium: 139

## 2010-12-16 LAB — HEPATIC FUNCTION PANEL
Albumin: 3.7
Alkaline Phosphatase: 78
Total Protein: 6.9

## 2010-12-16 LAB — DRUGS OF ABUSE SCREEN W/O ALC, ROUTINE URINE
Amphetamine Screen, Ur: NEGATIVE
Benzodiazepines.: NEGATIVE
Cocaine Metabolites: NEGATIVE
Creatinine,U: 153.8
Methadone: NEGATIVE
Opiate Screen, Urine: NEGATIVE

## 2011-11-21 ENCOUNTER — Emergency Department (INDEPENDENT_AMBULATORY_CARE_PROVIDER_SITE_OTHER)
Admission: EM | Admit: 2011-11-21 | Discharge: 2011-11-21 | Disposition: A | Discharge status: Home / Self Care | Payer: Self-pay | Source: Home / Self Care

## 2011-11-21 ENCOUNTER — Encounter (HOSPITAL_COMMUNITY): Payer: Self-pay

## 2011-11-21 DIAGNOSIS — M7712 Lateral epicondylitis, left elbow: Secondary | ICD-10-CM

## 2011-11-21 DIAGNOSIS — M7710 Lateral epicondylitis, unspecified elbow: Secondary | ICD-10-CM

## 2011-11-21 MED ORDER — TRIAMCINOLONE ACETONIDE 40 MG/ML IJ SUSP
INTRAMUSCULAR | Status: AC
  Filled 2011-11-21: qty 5

## 2011-11-21 MED ORDER — TRIAMCINOLONE ACETONIDE 40 MG/ML IJ SUSP
40.0000 mg | Freq: Once | INTRAMUSCULAR | Status: AC
  Administered 2011-11-21: 40 mg via INTRA_ARTICULAR

## 2011-11-21 NOTE — ED Provider Notes (Signed)
History     CSN: [id number]  Arrival date & time 11/21/11  1005   None     No chief complaint on file.   (Consider location/radiation/quality/duration/timing/severity/associated sxs/prior treatment) HPI Comments: Gradual onset of R lateral elbow pain over past 48hrs. He regularly donates plasma with the R arm and has to repeat the sqweezing action with R hand. His work entails pots and pans washer as well. Certain movement, esp those that involve wrist and gripping produce the pain over the lateral epicondyle and proximal forearm tendons. No blunt or tortion type injuries.    No past medical history on file.  No past surgical history on file.  No family history on file.  History  Substance Use Topics  . Smoking status: Not on file  . Smokeless tobacco: Not on file  . Alcohol Use: Not on file      Review of Systems  Constitutional: Negative.   Respiratory: Negative.   Gastrointestinal: Negative.   Genitourinary: Negative.   Musculoskeletal:       As per HPI  Skin: Negative.   Neurological: Negative for dizziness, weakness, numbness and headaches.    Allergies  Review of patient's allergies indicates not on file.  Home Medications  No current outpatient prescriptions on file.  BP 141/85  Pulse 61  Temp 97.9 F (36.6 C) (Oral)  Resp 18  SpO2 96%  Physical Exam  Constitutional: He is oriented to person, place, and time. He appears well-developed and well-nourished.  HENT:  Head: Normocephalic and atraumatic.  Eyes: EOM are normal. Left eye exhibits no discharge.  Neck: Normal range of motion. Neck supple.  Musculoskeletal:       Tenderness over the lateral epicondyle soft tissues. Wrist extension against resistance is painful and weak. But no wrist pain or tenderness.  Distal N/V, Sensory intact. Motor weak.   Neurological: He is alert and oriented to person, place, and time. No cranial nerve deficit.  Skin: Skin is warm and dry.  Psychiatric: He has a  normal mood and affect.    ED Course  Injection tendon or ligament Date/Time: 11/21/2011 11:47 AM Performed by: Phineas Real, Joedy Eickhoff Authorized by: Phineas Real, Lamyiah Crawshaw Consent: Verbal consent obtained. Risks and benefits: risks, benefits and alternatives were discussed Consent given by: patient Patient understanding: patient states understanding of the procedure being performed Local anesthesia used: no Patient sedated: no Patient tolerance: Patient tolerated the procedure well with no immediate complications. Comments: Marcaine 1.28ml and Kenalog 1.84ml injected around the tendons of the epicondyl site of tenderness.    (including critical care time)  Labs Reviewed - No data to display No results found.   1. Lateral epicondylitis  of elbow       MDM  Ice to elbow, limit activity that causes pain,  Injection of Marcaine and kenalog to lateral epicondyle as above.         Hayden Rasmussen, NP 11/21/11 1152  Hayden Rasmussen, NP 11/21/11 1204

## 2011-11-21 NOTE — ED Notes (Signed)
States he has been having pain in his right arm since plasma donation 1 month ago; NAD at present

## 2011-11-22 NOTE — ED Provider Notes (Signed)
Medical screening examination/treatment/procedure(s) were performed by non-physician practitioner and as supervising physician I was immediately available for consultation/collaboration.  Luiz Blare MD   Luiz Blare, MD 11/22/11 [phone]

## 2011-11-25 NOTE — ED Notes (Signed)
Pt. brought in form from [facility] to release him to donate plasma again. Form given to Hayden Rasmussen NP.

## 2012-03-24 ENCOUNTER — Encounter (HOSPITAL_COMMUNITY): Payer: Self-pay | Admitting: Emergency Medicine

## 2012-03-24 ENCOUNTER — Emergency Department (HOSPITAL_COMMUNITY)
Admission: EM | Admit: 2012-03-24 | Discharge: 2012-03-24 | Disposition: A | Discharge status: Home / Self Care | Payer: Self-pay | Attending: Emergency Medicine | Admitting: Emergency Medicine

## 2012-03-24 DIAGNOSIS — M545 Low back pain, unspecified: Secondary | ICD-10-CM | POA: Insufficient documentation

## 2012-03-24 DIAGNOSIS — F17200 Nicotine dependence, unspecified, uncomplicated: Secondary | ICD-10-CM | POA: Insufficient documentation

## 2012-03-24 DIAGNOSIS — Z87828 Personal history of other (healed) physical injury and trauma: Secondary | ICD-10-CM | POA: Insufficient documentation

## 2012-03-24 DIAGNOSIS — G8929 Other chronic pain: Secondary | ICD-10-CM | POA: Insufficient documentation

## 2012-03-24 DIAGNOSIS — M549 Dorsalgia, unspecified: Secondary | ICD-10-CM

## 2012-03-24 MED ORDER — IBUPROFEN 800 MG PO TABS
800.0000 mg | ORAL_TABLET | Freq: Once | ORAL | Status: AC
  Administered 2012-03-24: 800 mg via ORAL
  Filled 2012-03-24: qty 1

## 2012-03-24 MED ORDER — CYCLOBENZAPRINE HCL 10 MG PO TABS
10.0000 mg | ORAL_TABLET | Freq: Once | ORAL | Status: AC
  Administered 2012-03-24: 10 mg via ORAL
  Filled 2012-03-24: qty 1

## 2012-03-24 MED ORDER — CYCLOBENZAPRINE HCL 10 MG PO TABS: 10.0000 mg | ORAL_TABLET | Freq: Three times a day (TID) | ORAL | Status: DC | PRN

## 2012-03-24 MED ORDER — OXYCODONE-ACETAMINOPHEN 5-325 MG PO TABS: 1.0000 | ORAL_TABLET | ORAL | Status: DC | PRN

## 2012-03-24 MED ORDER — OXYCODONE-ACETAMINOPHEN 5-325 MG PO TABS
1.0000 | ORAL_TABLET | Freq: Once | ORAL | Status: AC
  Administered 2012-03-24: 1 via ORAL
  Filled 2012-03-24: qty 1
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
## 2012-03-24 NOTE — ED Notes (Signed)
Girlfriend at bedside.

## 2012-03-24 NOTE — ED Notes (Signed)
Patient discharge reviewed with prescriptions. Made aware not to operate any heavy machinery. Patient stated that he needed to go to work tomorrow. Instructed to take medication as directed and not to take the oxycodone before or while at work.

## 2012-03-24 NOTE — ED Notes (Signed)
Bed:WA10<BR> Expected date:<BR> Expected time:<BR> Means of arrival:<BR> Comments:<BR> EMS

## 2012-03-24 NOTE — ED Notes (Signed)
MD at bedside. 
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
## 2013-06-17 ENCOUNTER — Emergency Department (INDEPENDENT_AMBULATORY_CARE_PROVIDER_SITE_OTHER)
Admission: EM | Admit: 2013-06-17 | Discharge: 2013-06-17 | Disposition: A | Discharge status: Home / Self Care | Payer: BC Managed Care – PPO | Source: Home / Self Care | Attending: Family Medicine | Admitting: Family Medicine

## 2013-06-17 ENCOUNTER — Encounter (HOSPITAL_COMMUNITY): Payer: Self-pay | Admitting: Emergency Medicine

## 2013-06-17 DIAGNOSIS — Z96659 Presence of unspecified artificial knee joint: Secondary | ICD-10-CM

## 2013-06-17 DIAGNOSIS — M25569 Pain in unspecified knee: Secondary | ICD-10-CM

## 2013-06-17 MED ORDER — NAPROXEN 500 MG PO TABS: 500.0000 mg | ORAL_TABLET | Freq: Two times a day (BID) | ORAL | Status: DC

## 2013-06-17 MED ORDER — HYDROCODONE-ACETAMINOPHEN 5-325 MG PO TABS: 1.0000 | ORAL_TABLET | Freq: Four times a day (QID) | ORAL | Status: DC | PRN

## 2013-06-17 NOTE — Discharge Instructions (Signed)
Knee Pain Knee pain can be a result of an injury or other medical conditions. Treatment will depend on the cause of your pain. HOME CARE  Only take medicine as told by your doctor.  Keep a healthy weight. Being overweight can make the knee hurt more.  Stretch before exercising or playing sports.  If there is constant knee pain, change the way you exercise. Ask your doctor for advice.  Make sure shoes fit well. Choose the right shoe for the sport or activity.  Protect your knees. Wear kneepads if needed.  Rest when you are tired. GET HELP RIGHT AWAY IF:   Your knee pain does not stop.  Your knee pain does not get better.  Your knee joint feels hot to the touch.  You have a fever. MAKE SURE YOU:   Understand these instructions.  Will watch this condition.  Will get help right away if you are not doing well or get worse. Document Released: 05/20/2008 Document Revised: 05/16/2011 Document Reviewed: 05/20/2008 Exit[employer] Patient Information 2014 [employer].  
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
## 2013-06-18 NOTE — ED Provider Notes (Signed)
Medical screening examination/treatment/procedure(s) were performed by resident physician or non-physician practitioner and as supervising physician I was immediately available for consultation/collaboration.   Aiden Helzer DOUGLAS MD.   Kaila Devries D Nickie Warwick, MD 06/18/13 1114 

## 2014-02-19 ENCOUNTER — Encounter: Payer: Self-pay | Admitting: Internal Medicine

## 2014-02-19 ENCOUNTER — Ambulatory Visit: Payer: 59 | Attending: Internal Medicine | Admitting: Internal Medicine

## 2014-02-19 VITALS — BP 132/86 | HR 79 | Temp 98.0°F | Resp 16 | Ht 72.0 in | Wt 221.0 lb

## 2014-02-19 DIAGNOSIS — Z720 Tobacco use: Secondary | ICD-10-CM | POA: Diagnosis not present

## 2014-02-19 DIAGNOSIS — M25561 Pain in right knee: Secondary | ICD-10-CM | POA: Diagnosis not present

## 2014-02-19 DIAGNOSIS — G8929 Other chronic pain: Secondary | ICD-10-CM | POA: Diagnosis not present

## 2014-02-19 DIAGNOSIS — M25562 Pain in left knee: Secondary | ICD-10-CM | POA: Diagnosis not present

## 2014-02-19 DIAGNOSIS — F17200 Nicotine dependence, unspecified, uncomplicated: Secondary | ICD-10-CM

## 2014-02-19 MED ORDER — TRAMADOL HCL 50 MG PO TABS: 50.0000 mg | ORAL_TABLET | Freq: Two times a day (BID) | ORAL | Status: DC | PRN

## 2014-02-19 NOTE — Patient Instructions (Signed)
Stimulant Use Disorder-Cocaine °Cocaine is one of a group of powerful drugs called stimulants. Cocaine has medical uses for stopping nosebleeds and for pain control before minor nose or dental surgery. However, cocaine is misused because of the effects that it produces. These effects include:  °· A feeling of extreme pleasure. °· Alertness. °· High energy. °Common street names for cocaine include coke, crack, blow, snow, and nose candy. Cocaine is snorted, dissolved in water and injected, or smoked.  °Stimulants are addictive because they activate regions of the brain that produce both the pleasurable sensation of "reward" and psychological dependence. Together, these actions account for loss of control and the rapid development of drug dependence. This means you become ill without the drug (withdrawal) and need to keep using it to function.  °Stimulant use disorder is use of stimulants that disrupts your daily life. It disrupts relationships with family and friends and how you do your job. Cocaine increases your blood pressure and heart rate. It can cause a heart attack or stroke. Cocaine can also cause death from irregular heart rate or seizures. °SYMPTOMS °Symptoms of stimulant use disorder with cocaine include: °· Use of cocaine in larger amounts or over a longer period of time than intended. °· Unsuccessful attempts to cut down or control cocaine use. °· A lot of time spent obtaining, using, or recovering from the effects of cocaine. °· A strong desire or urge to use cocaine (craving). °· Continued use of cocaine in spite of major problems at work, school, or home because of use. °· Continued use of cocaine in spite of relationship problems because of use. °· Giving up or cutting down on important life activities because of cocaine use. °· Use of cocaine over and over in situations when it is physically hazardous, such as driving a car. °· Continued use of cocaine in spite of a physical problem that is likely  related to use. Physical problems can include: °¨ Malnutrition. °¨ Nosebleeds. °¨ Chest pain. °¨ High blood pressure. °¨ A hole that develops between the part of your nose that separates your nostrils (perforated nasal septum). °¨ Lung and kidney damage. °· Continued use of cocaine in spite of a mental problem that is likely related to use. Mental problems can include: °¨ Schizophrenia-like symptoms. °¨ Depression. °¨ Bipolar mood swings. °¨ Anxiety. °¨ Sleep problems. °· Need to use more and more cocaine to get the same effect, or lessened effect over time with use of the same amount of cocaine (tolerance). °· Having withdrawal symptoms when cocaine use is stopped, or using cocaine to reduce or avoid withdrawal symptoms. Withdrawal symptoms include: °¨ Depressed or irritable mood. °¨ Low energy or restlessness. °¨ Bad dreams. °¨ Poor or excessive sleep. °¨ Increased appetite. °DIAGNOSIS °Stimulant use disorder is diagnosed by your health care provider. You may be asked questions about your cocaine use and how it affects your life. A physical exam may be done. A drug screen may be ordered. You may be referred to a mental health professional. The diagnosis of stimulant use disorder requires at least two symptoms within 12 months. The type of stimulant use disorder depends on the number of signs and symptoms you have. The type may be: °· Mild. Two or three signs and symptoms. °· Moderate. Four or five signs and symptoms. °· Severe. Six or more signs and symptoms. °TREATMENT °Treatment for stimulant use disorder is usually provided by mental health professionals with training in substance use disorders. The following options are available: °·   Counseling or talk therapy. Talk therapy addresses the reasons you use cocaine and ways to keep you from using again. Goals of talk therapy include: °¨ Identifying and avoiding triggers for use. °¨ Handling cravings. °¨ Replacing use with healthy activities. °· Support groups.  Support groups provide emotional support, advice, and guidance. °· Medicine. Certain medicines may decrease cocaine cravings or withdrawal symptoms. °HOME CARE INSTRUCTIONS °· Take medicines only as directed by your health care provider. °· Identify the people and activities that trigger your cocaine use and avoid them. °· Keep all follow-up visits as directed by your health care provider. °SEEK MEDICAL CARE IF: °· Your symptoms get worse or you relapse. °· You are not able to take medicines as directed. °SEEK IMMEDIATE MEDICAL CARE IF: °· You have serious thoughts about hurting yourself or others. °· You have a seizure, chest pain, sudden weakness, or loss of speech or vision. °FOR MORE INFORMATION °· National Institute on Drug Abuse: www.drugabuse.gov °· Substance Abuse and Mental Health Services Administration: www.samhsa.gov °Document Released: 02/19/2000 Document Revised: 07/08/2013 Document Reviewed: 03/06/2013 °ExitCare® Patient Information ©2015 [employer]. This information is not intended to replace advice given to you by your health care provider. Make sure you discuss any questions you have with your health care provider. ° °
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
## 2014-02-27 ENCOUNTER — Other Ambulatory Visit: Payer: 59

## 2014-03-10 ENCOUNTER — Ambulatory Visit: Payer: 59 | Attending: Internal Medicine

## 2014-03-10 DIAGNOSIS — Z0000 Encounter for general adult medical examination without abnormal findings: Secondary | ICD-10-CM

## 2014-03-10 LAB — CBC WITH DIFFERENTIAL/PLATELET
Basophils Absolute: 0 10*3/uL (ref 0.0–0.1)
Basophils Relative: 0 % (ref 0–1)
Eosinophils Absolute: 0.1 10*3/uL (ref 0.0–0.7)
Eosinophils Relative: 2 % (ref 0–5)
HEMATOCRIT: 43.7 % (ref 39.0–52.0)
HEMOGLOBIN: 14.7 g/dL (ref 13.0–17.0)
LYMPHS ABS: 1.6 10*3/uL (ref 0.7–4.0)
Lymphocytes Relative: 26 % (ref 12–46)
MCH: 31.2 pg (ref 26.0–34.0)
MCHC: 33.6 g/dL (ref 30.0–36.0)
MCV: 92.8 fL (ref 78.0–100.0)
MONOS PCT: 10 % (ref 3–12)
MPV: 9.8 fL (ref 8.6–12.4)
Monocytes Absolute: 0.6 10*3/uL (ref 0.1–1.0)
NEUTROS ABS: 3.8 10*3/uL (ref 1.7–7.7)
NEUTROS PCT: 62 % (ref 43–77)
Platelets: 318 10*3/uL (ref 150–400)
RBC: 4.71 MIL/uL (ref 4.22–5.81)
RDW: 13.7 % (ref 11.5–15.5)
WBC: 6.1 10*3/uL (ref 4.0–10.5)

## 2014-03-10 LAB — BASIC METABOLIC PANEL
BUN: 16 mg/dL (ref 6–23)
CO2: 27 meq/L (ref 19–32)
CREATININE: 1.09 mg/dL (ref 0.50–1.35)
Calcium: 9.4 mg/dL (ref 8.4–10.5)
Chloride: 103 mEq/L (ref 96–112)
GLUCOSE: 119 mg/dL — AB (ref 70–99)
Potassium: 4.6 mEq/L (ref 3.5–5.3)
Sodium: 137 mEq/L (ref 135–145)

## 2014-03-10 LAB — LIPID PANEL
CHOLESTEROL: 184 mg/dL (ref 0–200)
HDL: 35 mg/dL — AB (ref >39)
LDL CALC: 120 mg/dL — AB (ref 0–99)
TRIGLYCERIDES: 145 mg/dL (ref <150)
Total CHOL/HDL Ratio: 5.3 Ratio
VLDL: 29 mg/dL (ref 0–40)

## 2014-03-12 ENCOUNTER — Telehealth: Payer: Self-pay | Admitting: *Deleted

## 2014-03-12 NOTE — Telephone Encounter (Signed)
Unable to contact patient, phone number disconnected.

## 2014-03-12 NOTE — Telephone Encounter (Signed)
-----   Message from Ambrose FinlandValerie A Keck, NP sent at 03/10/2014 11:30 PM EST ----- Cholesterol elevated. Please provide appropriate education regarding diet and exercise.  Please send prescription for atorvastatin 20 mg to take daily. All other labs are WNL

## 2014-03-14 ENCOUNTER — Encounter: Payer: Self-pay | Admitting: Family Medicine

## 2014-03-14 ENCOUNTER — Ambulatory Visit (INDEPENDENT_AMBULATORY_CARE_PROVIDER_SITE_OTHER): Payer: 59 | Admitting: Family Medicine

## 2014-03-14 VITALS — BP 135/82 | Ht 74.0 in | Wt 219.0 lb

## 2014-03-14 DIAGNOSIS — G8929 Other chronic pain: Secondary | ICD-10-CM | POA: Insufficient documentation

## 2014-03-14 DIAGNOSIS — Z9889 Other specified postprocedural states: Secondary | ICD-10-CM | POA: Insufficient documentation

## 2014-03-14 DIAGNOSIS — Z96653 Presence of artificial knee joint, bilateral: Secondary | ICD-10-CM

## 2014-03-14 DIAGNOSIS — Z8781 Personal history of (healed) traumatic fracture: Secondary | ICD-10-CM

## 2014-03-14 DIAGNOSIS — Z96651 Presence of right artificial knee joint: Secondary | ICD-10-CM | POA: Insufficient documentation

## 2014-03-14 DIAGNOSIS — M25569 Pain in unspecified knee: Secondary | ICD-10-CM

## 2014-03-14 DIAGNOSIS — Z967 Presence of other bone and tendon implants: Secondary | ICD-10-CM
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
## 2014-03-14 NOTE — Assessment & Plan Note (Signed)
Discussion He is s/p B TKR so there is nothing I can offer him He does not want any more surgical procedures so a referral to orthopedics does not seem to be what he desires. He asked for some pain medicine and we do not do chronic pain management here so i referred him back to his PCP for further discussion.

## 2015-11-05 ENCOUNTER — Encounter: Payer: Self-pay | Admitting: Internal Medicine

## 2017-10-04 ENCOUNTER — Ambulatory Visit (INDEPENDENT_AMBULATORY_CARE_PROVIDER_SITE_OTHER): Payer: BLUE CROSS/BLUE SHIELD | Admitting: Orthopaedic Surgery

## 2017-11-09 ENCOUNTER — Ambulatory Visit (INDEPENDENT_AMBULATORY_CARE_PROVIDER_SITE_OTHER): Payer: BLUE CROSS/BLUE SHIELD | Admitting: Orthopaedic Surgery

## 2017-11-22 ENCOUNTER — Ambulatory Visit (INDEPENDENT_AMBULATORY_CARE_PROVIDER_SITE_OTHER): Payer: Self-pay

## 2017-11-22 ENCOUNTER — Encounter (INDEPENDENT_AMBULATORY_CARE_PROVIDER_SITE_OTHER): Payer: Self-pay | Admitting: Orthopaedic Surgery

## 2017-11-22 ENCOUNTER — Ambulatory Visit (INDEPENDENT_AMBULATORY_CARE_PROVIDER_SITE_OTHER): Payer: Self-pay | Admitting: Orthopaedic Surgery

## 2017-11-22 DIAGNOSIS — M25562 Pain in left knee: Secondary | ICD-10-CM

## 2017-11-22 DIAGNOSIS — M25561 Pain in right knee: Secondary | ICD-10-CM

## 2017-11-22 DIAGNOSIS — G8929 Other chronic pain: Secondary | ICD-10-CM

## 2017-11-22 DIAGNOSIS — Z96651 Presence of right artificial knee joint: Secondary | ICD-10-CM

## 2017-11-22 DIAGNOSIS — Z96652 Presence of left artificial knee joint: Secondary | ICD-10-CM
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
## 2017-12-13 ENCOUNTER — Ambulatory Visit (INDEPENDENT_AMBULATORY_CARE_PROVIDER_SITE_OTHER): Payer: Medicare Other | Admitting: Orthopaedic Surgery

## 2017-12-13 ENCOUNTER — Encounter (INDEPENDENT_AMBULATORY_CARE_PROVIDER_SITE_OTHER): Payer: Self-pay | Admitting: Orthopaedic Surgery

## 2017-12-13 DIAGNOSIS — Z96652 Presence of left artificial knee joint: Secondary | ICD-10-CM | POA: Diagnosis not present

## 2017-12-13 DIAGNOSIS — M5441 Lumbago with sciatica, right side: Secondary | ICD-10-CM | POA: Diagnosis not present

## 2017-12-13 DIAGNOSIS — Z96651 Presence of right artificial knee joint: Secondary | ICD-10-CM

## 2017-12-13 MED ORDER — TIZANIDINE HCL 4 MG PO TABS: 4.0000 mg | ORAL_TABLET | Freq: Four times a day (QID) | ORAL | 0 refills | Status: DC | PRN

## 2017-12-13 MED ORDER — METHYLPREDNISOLONE 4 MG PO TABS: ORAL_TABLET | ORAL | 0 refills | Status: DC

## 2017-12-13 NOTE — Progress Notes (Signed)
The patient returns for follow-up with a history of bilateral knee replacements that were done remotely.  He says right now his knees are so-so and not really bad enough to have anything done.  To me his right knee was stable with his left knee shows polyethylene liner wear.  He does have a significant leg length discrepancy from a previous femur fracture on the right side.  We sent him to biotech for a shoe lift but he said insurance would not cover this and they were too expensive for him.  He comes in today saying his knees again are so-so but his back is hurting.  He points the right side of his lumbar spine source of his pain.  Does radiate into the sciatic region.  This is likely posture related from his gait being off from the ligament discrepancy.  There is no numbness and tingling either feet today.  He has a normal exam in terms of negative straight leg raise of pain in the lower aspect of the spine.  He is not interested in any other intervention such as an injection.  I will send in a steroid taper and a muscle relaxant for him.  He will work on core strengthening exercises and back exercises as well as extension exercises.  We discussed to watch his knees for now since he does not wish to have any intervention done since the knees are not hurting bad.  Follow be as needed for now.

## 2019-03-04 ENCOUNTER — Other Ambulatory Visit: Payer: Self-pay

## 2019-03-04 ENCOUNTER — Emergency Department (HOSPITAL_COMMUNITY): Payer: No Typology Code available for payment source

## 2019-03-04 ENCOUNTER — Emergency Department (HOSPITAL_COMMUNITY)
Admission: EM | Admit: 2019-03-04 | Discharge: 2019-03-04 | Disposition: A | Discharge status: Home / Self Care | Payer: No Typology Code available for payment source | Attending: Emergency Medicine | Admitting: Emergency Medicine

## 2019-03-04 DIAGNOSIS — Z96653 Presence of artificial knee joint, bilateral: Secondary | ICD-10-CM | POA: Diagnosis not present

## 2019-03-04 DIAGNOSIS — F17210 Nicotine dependence, cigarettes, uncomplicated: Secondary | ICD-10-CM | POA: Insufficient documentation

## 2019-03-04 DIAGNOSIS — S46912A Strain of unspecified muscle, fascia and tendon at shoulder and upper arm level, left arm, initial encounter: Secondary | ICD-10-CM | POA: Diagnosis not present

## 2019-03-04 DIAGNOSIS — Y999 Unspecified external cause status: Secondary | ICD-10-CM | POA: Insufficient documentation

## 2019-03-04 DIAGNOSIS — S46911A Strain of unspecified muscle, fascia and tendon at shoulder and upper arm level, right arm, initial encounter: Secondary | ICD-10-CM | POA: Diagnosis not present

## 2019-03-04 DIAGNOSIS — Y92410 Unspecified street and highway as the place of occurrence of the external cause: Secondary | ICD-10-CM | POA: Diagnosis not present

## 2019-03-04 DIAGNOSIS — Y93I9 Activity, other involving external motion: Secondary | ICD-10-CM | POA: Insufficient documentation

## 2019-03-04 DIAGNOSIS — S4992XA Unspecified injury of left shoulder and upper arm, initial encounter: Secondary | ICD-10-CM | POA: Diagnosis present
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
## 2019-03-04 NOTE — Discharge Instructions (Addendum)
Please follow-up with orthopedics if you continue to have pain in your shoulder.  In the meantime use tizanidine and Mobic that you have been prescribed.

## 2019-06-05 ENCOUNTER — Other Ambulatory Visit: Payer: Self-pay

## 2019-06-05 ENCOUNTER — Emergency Department (HOSPITAL_COMMUNITY): Payer: Medicare Other

## 2019-06-05 ENCOUNTER — Encounter (HOSPITAL_COMMUNITY): Payer: Self-pay | Admitting: Emergency Medicine

## 2019-06-05 ENCOUNTER — Emergency Department (HOSPITAL_COMMUNITY)
Admission: EM | Admit: 2019-06-05 | Discharge: 2019-06-05 | Disposition: A | Discharge status: Home / Self Care | Payer: Medicare Other | Attending: Emergency Medicine | Admitting: Emergency Medicine

## 2019-06-05 DIAGNOSIS — S63115A Dislocation of metacarpophalangeal joint of left thumb, initial encounter: Secondary | ICD-10-CM | POA: Diagnosis not present

## 2019-06-05 DIAGNOSIS — Z2914 Encounter for prophylactic rabies immune globin: Secondary | ICD-10-CM | POA: Insufficient documentation

## 2019-06-05 DIAGNOSIS — W540XXA Bitten by dog, initial encounter: Secondary | ICD-10-CM | POA: Diagnosis not present

## 2019-06-05 DIAGNOSIS — Z203 Contact with and (suspected) exposure to rabies: Secondary | ICD-10-CM | POA: Diagnosis not present

## 2019-06-05 DIAGNOSIS — S51832A Puncture wound without foreign body of left forearm, initial encounter: Secondary | ICD-10-CM | POA: Diagnosis not present

## 2019-06-05 DIAGNOSIS — Z23 Encounter for immunization: Secondary | ICD-10-CM | POA: Insufficient documentation

## 2019-06-05 DIAGNOSIS — S61412A Laceration without foreign body of left hand, initial encounter: Secondary | ICD-10-CM | POA: Insufficient documentation

## 2019-06-05 DIAGNOSIS — S6992XA Unspecified injury of left wrist, hand and finger(s), initial encounter: Secondary | ICD-10-CM | POA: Diagnosis present

## 2019-06-05 DIAGNOSIS — Y929 Unspecified place or not applicable: Secondary | ICD-10-CM | POA: Diagnosis not present

## 2019-06-05 DIAGNOSIS — Y939 Activity, unspecified: Secondary | ICD-10-CM | POA: Diagnosis not present

## 2019-06-05 DIAGNOSIS — F17210 Nicotine dependence, cigarettes, uncomplicated: Secondary | ICD-10-CM | POA: Insufficient documentation

## 2019-06-05 DIAGNOSIS — Y999 Unspecified external cause status: Secondary | ICD-10-CM | POA: Diagnosis not present

## 2019-06-05 LAB — CBC WITH DIFFERENTIAL/PLATELET
Abs Immature Granulocytes: 0.03 10*3/uL (ref 0.00–0.07)
Basophils Absolute: 0.1 10*3/uL (ref 0.0–0.1)
Basophils Relative: 1 %
Eosinophils Absolute: 0.1 10*3/uL (ref 0.0–0.5)
Eosinophils Relative: 1 %
HCT: 42.9 % (ref 39.0–52.0)
Hemoglobin: 14.4 g/dL (ref 13.0–17.0)
Immature Granulocytes: 1 %
Lymphocytes Relative: 25 %
Lymphs Abs: 1.3 10*3/uL (ref 0.7–4.0)
MCH: 31.7 pg (ref 26.0–34.0)
MCHC: 33.6 g/dL (ref 30.0–36.0)
MCV: 94.5 fL (ref 80.0–100.0)
Monocytes Absolute: 0.4 10*3/uL (ref 0.1–1.0)
Monocytes Relative: 8 %
Neutro Abs: 3.5 10*3/uL (ref 1.7–7.7)
Neutrophils Relative %: 64 %
Platelets: 320 10*3/uL (ref 150–400)
RBC: 4.54 MIL/uL (ref 4.22–5.81)
RDW: 13.2 % (ref 11.5–15.5)
WBC: 5.4 10*3/uL (ref 4.0–10.5)
nRBC: 0 % (ref 0.0–0.2)

## 2019-06-05 LAB — BASIC METABOLIC PANEL
Anion gap: 13 (ref 5–15)
BUN: 11 mg/dL (ref 8–23)
CO2: 20 mmol/L — ABNORMAL LOW (ref 22–32)
Calcium: 9.4 mg/dL (ref 8.9–10.3)
Chloride: 106 mmol/L (ref 98–111)
Creatinine, Ser: 0.94 mg/dL (ref 0.61–1.24)
GFR calc Af Amer: >60 mL/min (ref >60)
GFR calc non Af Amer: >60 mL/min (ref >60)
Glucose, Bld: 141 mg/dL — ABNORMAL HIGH (ref 70–99)
Potassium: 3.3 mmol/L — ABNORMAL LOW (ref 3.5–5.1)
Sodium: 139 mmol/L (ref 135–145)

## 2019-06-05 LAB — ETHANOL: Alcohol, Ethyl (B): 90 mg/dL — ABNORMAL HIGH (ref <10)

## 2019-06-05 MED ORDER — IBUPROFEN 800 MG PO TABS
800.0000 mg | ORAL_TABLET | Freq: Once | ORAL | Status: AC
  Administered 2019-06-05: 800 mg via ORAL
  Filled 2019-06-05: qty 1

## 2019-06-05 MED ORDER — BACITRACIN ZINC 500 UNIT/GM EX OINT: TOPICAL_OINTMENT | Freq: Two times a day (BID) | CUTANEOUS | Status: DC

## 2019-06-05 MED ORDER — RABIES VACCINE, PCEC IM SUSR
1.0000 mL | Freq: Once | INTRAMUSCULAR | Status: AC
  Administered 2019-06-05: 06:00:00 1 mL via INTRAMUSCULAR
  Filled 2019-06-05: qty 1

## 2019-06-05 MED ORDER — TETANUS-DIPHTH-ACELL PERTUSSIS 5-2.5-18.5 LF-MCG/0.5 IM SUSP
0.5000 mL | Freq: Once | INTRAMUSCULAR | Status: AC
  Administered 2019-06-05: 05:00:00 0.5 mL via INTRAMUSCULAR
  Filled 2019-06-05: qty 0.5

## 2019-06-05 MED ORDER — OXYCODONE-ACETAMINOPHEN 5-325 MG PO TABS
1.0000 | ORAL_TABLET | Freq: Once | ORAL | Status: AC
  Administered 2019-06-05: 01:00:00 1 via ORAL
  Filled 2019-06-05: qty 1

## 2019-06-05 MED ORDER — AMOXICILLIN-POT CLAVULANATE 875-125 MG PO TABS
1.0000 | ORAL_TABLET | Freq: Once | ORAL | Status: AC
  Administered 2019-06-05: 05:00:00 1 via ORAL
  Filled 2019-06-05: qty 1

## 2019-06-05 MED ORDER — RABIES IMMUNE GLOBULIN 150 UNIT/ML IM INJ
20.0000 [IU]/kg | INJECTION | Freq: Once | INTRAMUSCULAR | Status: AC
  Administered 2019-06-05: 06:00:00 1875 [IU]
  Filled 2019-06-05 (×2): qty 12.5

## 2019-06-05 NOTE — ED Provider Notes (Signed)
[facility] EM[facility]ENCY DEPARTMENT Provider Note   CSN: [id number] Arrival date & time: 06/05/19  0054     History Chief Complaint  Patient presents with  . Dog Bite    Derek Duran is a 59 y.o. male.  The history is provided by the patient.  Animal Bite Contact animal:  Dog Location:  Shoulder/arm Shoulder/arm injury location:  L forearm and L hand Time since incident:  7 hours Pain details:    Quality:  Aching   Severity:  Mild   Timing:  Constant   Progression:  Unchanged Incident location:  Outside Provoked: unprovoked   Notifications:  None Animal's rabies vaccination status:  Unknown Animal in possession: no   Tetanus status:  Unknown Relieved by:  Nothing Worsened by:  Nothing Ineffective treatments:  None tried Associated symptoms: no fever   Was using alcohol and cocaine.  Unknown dog attacked him and bit left hand and forearm and scraped abdomen and then he tried to hold off dog injuring and deforming right thumb.       History reviewed. No pertinent past medical history.  Patient Active Problem List   Diagnosis Date Noted  . 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 S/P ORIF (open reduction internal fixation) fracture 03/14/2014  . Knee pain, chronic 03/14/2014    Past Surgical History:  Procedure Laterality Date  . KNEE SURGERY         No family history on file.  Social History   Tobacco Use  . Smoking status: Current Every Day Smoker  . Smokeless tobacco: Never Used  Substance Use Topics  . Alcohol use: Yes    Alcohol/week: 2.0 standard drinks    Types: 2 Standard drinks or equivalent per week  . Drug use: Yes    Types: Marijuana, Cocaine    Home Medications Prior to Admission medications   Medication Sig Start Date End Date Taking? Authorizing Provider  methylPREDNISolone (MEDROL) 4 MG tablet Medrol dose pack. Take as instructed 12/13/17    Mcarthur Rossetti, MD  tiZANidine (ZANAFLEX) 4 MG tablet Take 1 tablet (4 mg total) by mouth every 6 (six) hours as needed for muscle spasms. 12/13/17   Mcarthur Rossetti, MD  traMADol (ULTRAM) 50 MG tablet Take 1 tablet (50 mg total) by mouth every 12 (twelve) hours as needed. 02/19/14   Lance Bosch, NP    Allergies    Patient has no known allergies.  Review of Systems   Review of Systems  Constitutional: Negative for fever.  HENT: Negative for congestion.   Respiratory: Negative for cough and shortness of breath.   Cardiovascular: Negative for chest pain.  Gastrointestinal: Negative for abdominal pain.  Genitourinary: Negative for difficulty urinating.  Musculoskeletal: Positive for arthralgias. Negative for back pain.  Skin: Positive for wound.  Neurological: Negative for weakness.  Psychiatric/Behavioral: Negative for agitation.  All other systems reviewed and are negative.   Physical Exam Updated Vital Signs BP (!) 188/104   Pulse 76   Temp 98.4 F (36.9 C) (Oral)   Resp 20   Ht 5\' 11"  (1.803 m)   Wt 95 kg   SpO2 100%   BMI 29.21 kg/m   Physical Exam Vitals and nursing note reviewed.  Constitutional:      Appearance: Normal appearance.  HENT:     Head: Normocephalic and atraumatic.     Nose: Nose normal.  Eyes:  Conjunctiva/sclera: Conjunctivae normal.     Pupils: Pupils are equal, round, and reactive to light.  Cardiovascular:     Rate and Rhythm: Normal rate and regular rhythm.     Pulses: Normal pulses.     Heart sounds: Normal heart sounds.  Pulmonary:     Effort: Pulmonary effort is normal.     Breath sounds: Normal breath sounds.  Abdominal:     General: Abdomen is flat. Bowel sounds are normal.     Tenderness: There is no abdominal tenderness. There is no guarding. Negative signs include Murphy's sign and McBurney's sign.    Musculoskeletal:        General: Normal range of motion.     Right forearm: Normal.     Left forearm:  Laceration present.     Right wrist: Normal.     Left wrist: Normal.     Right hand: Deformity present. No swelling, tenderness or bony tenderness. Normal range of motion. Normal strength. Normal sensation. There is no disruption of two-point discrimination. Normal capillary refill. Normal pulse.     Left hand: Laceration present. No swelling, deformity or bony tenderness. Normal range of motion. Normal strength. Normal sensation. There is no disruption of two-point discrimination. Normal capillary refill. Normal pulse.       Arms:     Cervical back: Normal range of motion and neck supple.     Comments: r thumb is dislocated at MCP  Skin:    General: Skin is warm and dry.  Neurological:     Mental Status: He is alert.     ED Results / Procedures / Treatments   Labs (all labs ordered are listed, but only abnormal results are displayed) Labs Reviewed  BASIC METABOLIC PANEL - Abnormal; Notable for the following components:      Result Value   Potassium 3.3 (*)    CO2 20 (*)    Glucose, Bld 141 (*)    All other components within normal limits  ETHANOL - Abnormal; Notable for the following components:   Alcohol, Ethyl (B) 90 (*)    All other components within normal limits  CBC WITH DIFFERENTIAL/PLATELET    EKG None  Radiology DG Wrist Complete Left  Result Date: 06/05/2019 CLINICAL DATA:  Dog bites EXAM: LEFT WRIST - COMPLETE 3+ VIEW COMPARISON:  None. FINDINGS: No fracture or dislocation is seen. Mild joint space narrowing at the radiocarpal articulation. Multiple soft tissue lacerations along the ventral aspect of the wrist and dorsal aspect of the hand, corresponding to the patient's known dog bites. Associated mild dorsal soft tissue swelling. No radiopaque foreign body is seen. IMPRESSION: Dorsal soft tissue swelling with multiple soft tissue lacerations. No fracture, dislocation, or radiopaque foreign body is seen. Electronically Signed   By: Charline Bills M.D.   On:  06/05/2019 02:00   DG Hand Complete Right  Result Date: 06/05/2019 CLINICAL DATA:  Dog bites EXAM: RIGHT HAND - COMPLETE 3+ VIEW COMPARISON:  None. FINDINGS: No fracture or dislocation is seen. The joint spaces are preserved. Mild soft tissue swelling along the ventral aspect of the hand. No radiopaque foreign body is seen. IMPRESSION: No fracture, dislocation, or radiopaque foreign body is seen. Electronically Signed   By: Charline Bills M.D.   On: 06/05/2019 02:01    Procedures Reduction of dislocation  Date/Time: 06/05/2019 5:03 AM Performed by: Cy Blamer, MD Authorized by: Cy Blamer, MD  Consent: Verbal consent obtained. Consent given by: patient Patient understanding: patient states understanding of the  procedure being performed Patient identity confirmed: arm band Local anesthesia used: no  Anesthesia: Local anesthesia used: no  Sedation: Patient sedated: no  Patient tolerance: patient tolerated the procedure well with no immediate complications Comments: Thumb dislocation and the MCP relocated and splinted     (including critical care time)  Medications Ordered in ED Medications  Tdap (BOOSTRIX) injection 0.5 mL (has no administration in time range)  rabies vaccine (RABAVERT) injection 1 mL (has no administration in time range)  rabies immune globulin (HYPERAB/KEDRAB) injection 1,875 Units (has no administration in time range)  oxyCODONE-acetaminophen (PERCOCET/ROXICET) 5-325 MG per tablet 1 tablet (1 tablet Oral Given 06/05/19 0129)  amoxicillin-clavulanate (AUGMENTIN) 875-125 MG per tablet 1 tablet (1 tablet Oral Given 06/05/19 0459)  ibuprofen (ADVIL) tablet 800 mg (800 mg Oral Given 06/05/19 0459)    ED Course  I have reviewed the triage vital signs and the nursing notes.  Pertinent labs & imaging results that were available during my care of the patient were reviewed by me and considered in my medical decision making (see chart for details).   Dog is  unknown.  Wounds are dirty and cannot be closed.  rabies immunoglobulin and vaccine begun.  Wounds irrigated.  Patient will need to follow up with hand surgery regarding wound which are highly prone to infection as well as thumb dislocation as there may be a tendon injury secondary to fighting with the dog.  Patient was placed in a thumb spica splint in the ED.  Bacitracin applied to all wounds and steri strip applied to the left hand.    Derek Duran was evaluated in Emergency Department on 06/05/2019 for the symptoms described in the history of present illness. He was evaluated in the context of the global COVID-19 pandemic, which necessitated consideration that the patient might be at risk for infection with the SARS-CoV-2 virus that causes COVID-19. Institutional protocols and algorithms that pertain to the evaluation of patients at risk for COVID-19 are in a state of rapid change based on information released by regulatory bodies including the CDC and federal and state organizations. These policies and algorithms were followed during the patient's care in the ED.  Final Clinical Impression(s) / ED Diagnoses Final diagnoses:  Dog bite, initial encounter   Return for weakness, numbness, changes in vision or speech, fevers >100.4 unrelieved by medication, shortness of breath, intractable vomiting, or diarrhea, abdominal pain, Inability to tolerate liquids or food, cough, altered mental status or any concerns. No signs of systemic illness or infection. The patient is nontoxic-appearing on exam and vital signs are within normal limits.   I have reviewed the triage vital signs and the nursing notes. Pertinent labs &imaging results that were available during my care of the patient were reviewed by me and considered in my medical decision making (see chart for details).  After history, exam, and medical workup I feel the patient has been appropriately medically screened and is safe for discharge  home. Pertinent diagnoses were discussed with the patient. Patient was givenstrictreturn precautions.   Yorel Redder, MD 06/05/19 [phone]

## 2019-06-05 NOTE — ED Triage Notes (Signed)
Patient arrived with EMS from home reports dog bite to left hand /left distal forearm this evening , + ETOH and Cocaine last night , patient added injury to right hand with pain and swelling .

## 2019-06-05 NOTE — Progress Notes (Signed)
Orthopedic Tech Progress Note Patient Details:  Derek Duran [facility] [birth year] [id number]  Ortho Devices Type of Ortho Device: Arm sling, Thumb spica splint Splint Material: Fiberglass Ortho Device/Splint Location: rue thumb spica. lue arm sling left in room for pt. splint applied at drs request. Ortho Device/Splint Interventions: Ordered, Application, Adjustment   Post Interventions Patient Tolerated: Well Instructions Provided: Care of device, Adjustment of device   Trinna Post 06/05/2019, 5:33 AM

## 2019-06-05 NOTE — ED Notes (Signed)
Ortho at bedside.

## 2019-06-05 NOTE — ED Notes (Signed)
Discharge instructions discussed with pt. Pt verbalized understanding with no questions at this time.   Discharge instructions included rabies schedule.

## 2019-06-05 NOTE — Discharge Instructions (Addendum)
°                                  RABIES VACCINE FOLLOW UP  Patient's Name: Derek Duran                     Original Order Date:06/05/2019  Medical Record Number: [id number]  ED Physician: Cy Blamer, MD Primary Diagnosis: Rabies Exposure       PCP: Fleet Contras, MD  Patient Phone Number: (home) [phone] (home)    (cell)  Telephone Information:  Mobile [phone]  Mobile [phone]    (work) [phone] (work) Species of Animal:     You have been seen in the Emergency Department for a possible rabies exposure. It's very important you return for the additional vaccine doses.  Please call the clinic listed below for hours of operation.   Clinic that will administer your rabies vaccines:    DAY 0:  06/05/2019      DAY 3:  06/08/2019       DAY 7:  06/12/2019     DAY 14:  06/19/2019         The 5th vaccine injection is considered for immune compromised patients only.  DAY 28:  07/03/2019     Go to urgent care for vaccinations per CDC guidelines for the rest of the rabies vaccination series

## 2019-06-08 ENCOUNTER — Ambulatory Visit (HOSPITAL_COMMUNITY)
Admission: EM | Admit: 2019-06-08 | Discharge: 2019-06-08 | Disposition: A | Discharge status: Home / Self Care | Payer: Medicare Other | Attending: Family Medicine | Admitting: Family Medicine

## 2019-06-08 ENCOUNTER — Other Ambulatory Visit: Payer: Self-pay

## 2019-06-08 DIAGNOSIS — Z203 Contact with and (suspected) exposure to rabies: Secondary | ICD-10-CM

## 2019-06-08 DIAGNOSIS — Z23 Encounter for immunization: Secondary | ICD-10-CM

## 2019-06-08 MED ORDER — RABIES VACCINE, PCEC IM SUSR
INTRAMUSCULAR | Status: AC
  Filled 2019-06-08: qty 1

## 2019-06-08 MED ORDER — RABIES VACCINE, PCEC IM SUSR
1.0000 mL | Freq: Once | INTRAMUSCULAR | Status: AC
  Administered 2019-06-08: 1 mL via INTRAMUSCULAR

## 2019-06-08 NOTE — ED Triage Notes (Signed)
Pt presents for second rabies vaccine.  

## 2019-06-12 ENCOUNTER — Ambulatory Visit (HOSPITAL_COMMUNITY)
Admission: EM | Admit: 2019-06-12 | Discharge: 2019-06-12 | Disposition: A | Discharge status: Home / Self Care | Payer: Medicare Other

## 2019-06-12 ENCOUNTER — Other Ambulatory Visit: Payer: Self-pay

## 2019-06-12 ENCOUNTER — Encounter (HOSPITAL_COMMUNITY): Payer: Self-pay

## 2019-06-12 DIAGNOSIS — M79641 Pain in right hand: Secondary | ICD-10-CM

## 2019-06-12 DIAGNOSIS — M79642 Pain in left hand: Secondary | ICD-10-CM | POA: Diagnosis not present

## 2019-06-12 DIAGNOSIS — Z203 Contact with and (suspected) exposure to rabies: Secondary | ICD-10-CM

## 2019-06-12 MED ORDER — RABIES VACCINE, PCEC IM SUSR
INTRAMUSCULAR | Status: AC
  Filled 2019-06-12: qty 1

## 2019-06-12 MED ORDER — TRAMADOL HCL 50 MG PO TABS: 50.0000 mg | ORAL_TABLET | Freq: Two times a day (BID) | ORAL | 0 refills | Status: DC | PRN

## 2019-06-12 NOTE — Discharge Instructions (Signed)
We will put a removable splint on the right hand for you to wear and you can take off to shower. Wounds cleaned here and dressed.  No signs of infection today.  Follow-up with a specialist as planned.  We will give you some pain medicine to take in the meantime.

## 2019-06-12 NOTE — ED Triage Notes (Addendum)
Pt is here for his 3rd rabies injection, states he has a laceration that he wants checked out today. Pt is requesting pain meds.

## 2019-06-13 NOTE — ED Provider Notes (Signed)
Derek Duran    CSN: [id number] Arrival date & time: 06/12/19  1142      History   Chief Complaint Chief Complaint  Patient presents with  . Laceration  . Rabies Injection    HPI DERMOT Derek Duran is a 59 y.o. male.   Pt is a 59 year old male who presents today for check on wounds post dog bite and right hand pain.  He was seen in the ER for this and treated previously.  The wounds have been healing and he has been cleaning and dressing.  He denies any redness, swelling or drainage from the wounds.  He previously had splint to right arm which he removed yesterday due to wanting to take a shower.  He feels like he has normal range of motion in the  hand and wrist.  He is also here for his third rabies injection.  Reporting still having a lot of pain in the wound areas and right hand.  Plans to see hand specialist soon.  No numbness, tingling, weakness or fevers.  ROS per HPI      History reviewed. No pertinent past medical history.  Patient Active Problem List   Diagnosis Date Noted  . 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 S/P ORIF (open reduction internal fixation) fracture 03/14/2014  . Knee pain, chronic 03/14/2014    Past Surgical History:  Procedure Laterality Date  . KNEE SURGERY         Home Medications    Prior to Admission medications   Medication Sig Start Date End Date Taking? Authorizing Provider  traMADol (ULTRAM) 50 MG tablet Take 1 tablet (50 mg total) by mouth every 12 (twelve) hours as needed. 06/12/19   Orvan July, NP    Family History Family History  Problem Relation Age of Onset  . Healthy Mother   . Healthy Father     Social History Social History   Tobacco Use  . Smoking status: Current Every Day Smoker  . Smokeless tobacco: Never Used  Substance Use Topics  . Alcohol use: Yes    Alcohol/week: 2.0 standard drinks    Types: 2 Standard drinks or  equivalent per week  . Drug use: Yes    Types: Marijuana, Cocaine     Allergies   Patient has no known allergies.   Review of Systems Review of Systems   Physical Exam Triage Vital Signs ED Triage Vitals  Enc Vitals Group     BP 06/12/19 1248 (!) 142/70     Pulse Rate 06/12/19 1248 81     Resp 06/12/19 1248 18     Temp 06/12/19 1248 98 F (36.7 C)     Temp Source 06/12/19 1248 Oral     SpO2 06/12/19 1248 98 %     Weight 06/12/19 1246 206 lb (93.4 kg)     Height --      Head Circumference --      Peak Flow --      Pain Score 06/12/19 1245 0     Pain Loc --      Pain Edu? --      Excl. in [place]? --    No data found.  Updated Vital Signs BP (!) 142/70 (BP Location: Right Arm)   Pulse 81   Temp 98 F (36.7 C) (Oral)   Resp 18   Wt 206 lb (93.4 kg)   SpO2 98%   BMI  28.73 kg/m   Visual Acuity Right Eye Distance:   Left Eye Distance:   Bilateral Distance:    Right Eye Near:   Left Eye Near:    Bilateral Near:     Physical Exam Vitals and nursing note reviewed.  Constitutional:      Appearance: Normal appearance.  HENT:     Head: Normocephalic and atraumatic.     Nose: Nose normal.  Eyes:     Conjunctiva/sclera: Conjunctivae normal.  Pulmonary:     Effort: Pulmonary effort is normal.  Musculoskeletal:        General: Normal range of motion.     Cervical back: Normal range of motion.     Comments: Right hand without swelling or deformities.  Normal range of motion of hand and right thumb which was previously injured.  Skin:    General: Skin is warm and dry.     Comments: Healing wound to dorsum of left hand.  No erythema, swelling or drainage. Healing wound to left posterior forearm  Neurological:     Mental Status: He is alert.  Psychiatric:        Mood and Affect: Mood normal.      UC Treatments / Results  Labs (all labs ordered are listed, but only abnormal results are displayed) Labs Reviewed - No data to display  EKG   Radiology No  results found.  Procedures Procedures (including critical care time)  Medications Ordered in UC Medications - No data to display  Initial Impression / Assessment and Plan / UC Course  I have reviewed the triage vital signs and the nursing notes.  Pertinent labs & imaging results that were available during my care of the patient were reviewed by me and considered in my medical decision making (see chart for details).     Wound check and pain-all wounds appear to be healing nicely.  There is no signs of infection today. Patient reporting still having a lot of pain due to wounds and right hand. He currently does not have any splint on the right hand.  We will give him thumb spica to wear so he can take this off to shower if needed. He plans to follow-up with pain specialist and already has an appointment We will give him something for pain to help until his appointment Rabies injection given today Wounds cleaned and dressed  Follow up as needed for continued or worsening symptoms  Final Clinical Impressions(s) / UC Diagnoses   Final diagnoses:  Pain in both hands     Discharge Instructions     We will put a removable splint on the right hand for you to wear and you can take off to shower. Wounds cleaned here and dressed.  No signs of infection today.  Follow-up with a specialist as planned.  We will give you some pain medicine to take in the meantime.    ED Prescriptions    Medication Sig Dispense Auth. Provider   traMADol (ULTRAM) 50 MG tablet Take 1 tablet (50 mg total) by mouth every 12 (twelve) hours as needed. 12 tablet Aeneas Longsworth A, NP     I have reviewed the PDMP during this encounter.   Dahlia Byes A, NP 06/13/19 0600

## 2019-06-19 ENCOUNTER — Ambulatory Visit (HOSPITAL_COMMUNITY)
Admission: EM | Admit: 2019-06-19 | Discharge: 2019-06-19 | Disposition: A | Discharge status: Home / Self Care | Payer: Medicare Other | Attending: Urgent Care | Admitting: Urgent Care

## 2019-06-19 ENCOUNTER — Other Ambulatory Visit: Payer: Self-pay

## 2019-06-19 DIAGNOSIS — Z203 Contact with and (suspected) exposure to rabies: Secondary | ICD-10-CM | POA: Diagnosis not present

## 2019-06-19 DIAGNOSIS — Z23 Encounter for immunization: Secondary | ICD-10-CM | POA: Diagnosis not present

## 2019-06-19 MED ORDER — RABIES VACCINE, PCEC IM SUSR
1.0000 mL | Freq: Once | INTRAMUSCULAR | Status: AC
  Administered 2019-06-19: 1 mL via INTRAMUSCULAR

## 2019-06-19 MED ORDER — RABIES VACCINE, PCEC IM SUSR
INTRAMUSCULAR | Status: AC
  Filled 2019-06-19: qty 1

## 2019-06-19 NOTE — ED Triage Notes (Signed)
Pt presents for final rabies injection. 

## 2019-07-03 ENCOUNTER — Ambulatory Visit (HOSPITAL_COMMUNITY)
Admission: EM | Admit: 2019-07-03 | Discharge: 2019-07-03 | Disposition: A | Discharge status: Home / Self Care | Payer: Medicare Other

## 2019-07-03 ENCOUNTER — Encounter (HOSPITAL_COMMUNITY): Payer: Self-pay

## 2019-07-03 ENCOUNTER — Other Ambulatory Visit: Payer: Self-pay

## 2019-07-03 DIAGNOSIS — Z23 Encounter for immunization: Secondary | ICD-10-CM | POA: Diagnosis not present

## 2019-07-03 DIAGNOSIS — Z203 Contact with and (suspected) exposure to rabies: Secondary | ICD-10-CM | POA: Diagnosis not present

## 2019-07-03 MED ORDER — RABIES VACCINE, PCEC IM SUSR
INTRAMUSCULAR | Status: AC
  Filled 2019-07-03: qty 1

## 2019-07-03 NOTE — ED Triage Notes (Signed)
Pt is here for his final rabies injection.

## 2019-10-27 ENCOUNTER — Other Ambulatory Visit: Payer: Self-pay

## 2019-10-27 ENCOUNTER — Ambulatory Visit (HOSPITAL_COMMUNITY)
Admission: EM | Admit: 2019-10-27 | Discharge: 2019-10-27 | Disposition: A | Discharge status: Home / Self Care | Payer: Medicare Other | Attending: Emergency Medicine | Admitting: Emergency Medicine

## 2019-10-27 ENCOUNTER — Encounter (HOSPITAL_COMMUNITY): Payer: Self-pay

## 2019-10-27 DIAGNOSIS — K625 Hemorrhage of anus and rectum: Secondary | ICD-10-CM | POA: Diagnosis not present

## 2019-10-27 LAB — CBC
HCT: 37 % — ABNORMAL LOW (ref 39.0–52.0)
Hemoglobin: 12.5 g/dL — ABNORMAL LOW (ref 13.0–17.0)
MCH: 31.8 pg (ref 26.0–34.0)
MCHC: 33.8 g/dL (ref 30.0–36.0)
MCV: 94.1 fL (ref 80.0–100.0)
Platelets: 324 10*3/uL (ref 150–400)
RBC: 3.93 MIL/uL — ABNORMAL LOW (ref 4.22–5.81)
RDW: 12.9 % (ref 11.5–15.5)
WBC: 6.5 10*3/uL (ref 4.0–10.5)
nRBC: 0 % (ref 0.0–0.2)

## 2019-10-27 MED ORDER — PANTOPRAZOLE SODIUM 20 MG PO TBEC: 20.0000 mg | DELAYED_RELEASE_TABLET | Freq: Every day | ORAL | 0 refills | Status: AC

## 2019-10-27 MED ORDER — HYDROCORTISONE ACETATE 25 MG RE SUPP: 25.0000 mg | Freq: Two times a day (BID) | RECTAL | 0 refills | Status: DC

## 2019-10-27 NOTE — ED Provider Notes (Signed)
[facility]    CSN: [id number] Arrival date & time: 10/27/19  1426      History   Chief Complaint Chief Complaint  Patient presents with   Rectal Bleeding    HPI Derek Duran is a 59 y.o. male.   Derek Duran presents with complaints of blood to stool. Loose watery bloody stool. Started yesterday. Today is more watery. Dark and bright red. No black. Any solid stool is darker in color. Denies any previous similar. No pain. No dizziness. Drinks alcohol regularly, 2-3 times a week. Cocaine use as well, last night he used. Used after symptoms started. No vomiting. No previous colonscopy. No family history of colon cancer. Saw his PCP last week. States he can be constipated at times, and was last week.     ROS per HPI, negative if not otherwise mentioned.      History reviewed. No pertinent past medical history.  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 Authorizing Provider  hydrocortisone (ANUSOL-HC) 25 MG suppository Place 1 suppository (25 mg total) rectally 2 (two) times daily. 10/27/19   Georgetta Haber, NP  pantoprazole (PROTONIX) 20 MG tablet Take 1 tablet (20 mg total) by mouth daily. 10/27/19   Georgetta Haber, NP  tiZANidine (ZANAFLEX) 4 MG tablet Take 4 mg by mouth 2 (two) times daily. 07/01/19   [provider]  traMADol (ULTRAM) 50 MG tablet Take 1 tablet (50 mg total) by mouth every 12 (twelve) hours as needed. 06/12/19   Janace Aris, NP    Family History Family History  Problem Relation Age of Onset   Healthy Mother    Healthy  Father     Social History Social History   Tobacco Use   Smoking status: Current Every Day Smoker   Smokeless tobacco: Never Used  Substance Use Topics   Alcohol use: Yes    Alcohol/week: 2.0 standard drinks    Types: 2 Standard drinks or equivalent per week   Drug use: Yes    Types: Marijuana, Cocaine     Allergies   Patient has no known allergies.   Review of Systems Review of Systems   Physical Exam Triage Vital Signs ED Triage Vitals [10/27/19 1444]  Enc Vitals Group     BP (!) 146/89     Pulse Rate 79     Resp 18     Temp 98.6 F (37 C)     Temp Source Oral     SpO2 99 %     Weight      Height      Head Circumference      Peak Flow      Pain Score 0     Pain Loc      Pain Edu?      Excl. in [place]?    No data found.  Updated Vital Signs BP (!) 146/89 (BP Location: Right Arm)    Pulse 79    Temp 98.6 F (37 C) (Oral)    Resp 18    SpO2  99%   Visual Acuity Right Eye Distance:   Left Eye Distance:   Bilateral Distance:    Right Eye Near:   Left Eye Near:    Bilateral Near:     Physical Exam Constitutional:      Appearance: He is well-developed.  Cardiovascular:     Rate and Rhythm: Normal rate.  Pulmonary:     Effort: Pulmonary effort is normal.  Abdominal:     Tenderness: There is no abdominal tenderness.  Genitourinary:    Rectum: External hemorrhoid present.     Comments: Non tender external hemorrhoid noted; non tender with DRE with frank blood noted  Skin:    General: Skin is warm and dry.  Neurological:     Mental Status: He is alert and oriented to person, place, and time.      UC Treatments / Results  Labs (all labs ordered are listed, but only abnormal results are displayed) Labs Reviewed  CBC - Abnormal; Notable for the following components:      Result Value   RBC 3.93 (*)    Hemoglobin 12.5 (*)    HCT 37.0 (*)    All other components within normal limits    EKG   Radiology No results  found.  Procedures Procedures (including critical care time)  Medications Ordered in UC Medications - No data to display  Initial Impression / Assessment and Plan / UC Course  I have reviewed the triage vital signs and the nursing notes.  Pertinent labs & imaging results that were available during my care of the patient were reviewed by me and considered in my medical decision making (see chart for details).     Rectal bleeding, onset last night. No pain. Hemodynamically stable. External hemorrhoids with previous constipation. Substance use/abuse. protonix provided as well as hemorrhoid treatment with cbc pending. Encouraged follow up with GI. Return precautions provided. Patient verbalized understanding and agreeable to plan.   Final Clinical Impressions(s) / UC Diagnoses   Final diagnoses:  Rectal bleeding     Discharge Instructions     We will call you if your results are concerning at all.  You may use the suppository twice a day, may or may not be helpful.  Start protonix daily as well.  Avoid constipation as it can contribute to hemorrhoids.  Please follow up with gastroenterology for further evaluation and management.  Go to the ER if you develop abdominal pain, weakness, dizziness, vomiting blood, or otherwise worsening.  Limit alcohol use and cocaine use as they may be triggering symptoms as well.     ED Prescriptions    Medication Sig Dispense Auth. Provider   hydrocortisone (ANUSOL-HC) 25 MG suppository Place 1 suppository (25 mg total) rectally 2 (two) times daily. 12 suppository Tyson Masin B, NP   pantoprazole (PROTONIX) 20 MG tablet Take 1 tablet (20 mg total) by mouth daily. 30 tablet Georgetta Haber, NP     PDMP not reviewed this encounter.   Georgetta Haber, NP 10/27/19 2051

## 2019-10-27 NOTE — Discharge Instructions (Signed)
We will call you if your results are concerning at all.  You may use the suppository twice a day, may or may not be helpful.  Start protonix daily as well.  Avoid constipation as it can contribute to hemorrhoids.  Please follow up with gastroenterology for further evaluation and management.  Go to the ER if you develop abdominal pain, weakness, dizziness, vomiting blood, or otherwise worsening.  Limit alcohol use and cocaine use as they may be triggering symptoms as well.

## 2019-10-27 NOTE — ED Triage Notes (Signed)
Pt reports diarrhea full of blood since last night. Pt states he did cocaine and drank 4 cans of beers last night. Denies fever, chills, abdominal pain, chest pain.

## 2019-10-28 ENCOUNTER — Encounter: Payer: Self-pay | Admitting: Physician Assistant

## 2019-11-04 ENCOUNTER — Ambulatory Visit (INDEPENDENT_AMBULATORY_CARE_PROVIDER_SITE_OTHER): Payer: Medicare Other

## 2019-11-04 ENCOUNTER — Ambulatory Visit (INDEPENDENT_AMBULATORY_CARE_PROVIDER_SITE_OTHER): Payer: Medicare Other | Admitting: Physician Assistant

## 2019-11-04 ENCOUNTER — Encounter: Payer: Self-pay | Admitting: Physician Assistant

## 2019-11-04 DIAGNOSIS — M25551 Pain in right hip: Secondary | ICD-10-CM

## 2019-11-04 DIAGNOSIS — M545 Low back pain, unspecified: Secondary | ICD-10-CM
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
## 2019-11-29 ENCOUNTER — Encounter: Payer: Self-pay | Admitting: Physician Assistant

## 2019-11-29 ENCOUNTER — Ambulatory Visit (INDEPENDENT_AMBULATORY_CARE_PROVIDER_SITE_OTHER): Payer: Medicare Other | Admitting: Physician Assistant

## 2019-11-29 VITALS — BP 120/74 | HR 80 | Ht 71.0 in | Wt 193.1 lb

## 2019-11-29 DIAGNOSIS — K625 Hemorrhage of anus and rectum: Secondary | ICD-10-CM

## 2019-11-29 DIAGNOSIS — F1490 Cocaine use, unspecified, uncomplicated: Secondary | ICD-10-CM

## 2019-11-29 MED ORDER — SUTAB 1479-225-188 MG PO TABS: 1.0000 | ORAL_TABLET | Freq: Once | ORAL | 0 refills | Status: AC

## 2019-11-29 NOTE — Progress Notes (Signed)
Chief Complaint: Rectal bleeding  HPI:    [name] is a 59 year old African-American male with a past medical history as listed below, who was referred to me by Fleet Contras, MD for a complaint of rectal bleeding.      10/27/2019 patient seen at the urgent care for rectal bleeding.  He described blood in his stool, loose and watery which started the day before.  Scribe drinking alcohol regularly 2-3 times a week and cocaine as well, he had used the night before.  No previous colonoscopy.  Apparently describes some constipation the prior week.  Rectal exam showed external hemorrhoid, frank blood noted on DRE.  Hemoglobin 12.5.  Patient was started on hydrocortisone suppositories twice a day x6 days.  Also started on Protonix.    Today, the patient tells me that after the 24 to 36 hours of rectal bleeding that he had, about 8-10 bowel movements which were "just bright red blood", then it stopped.  He did buy some over-the-counter Preparation H suppositories and used them daily for about a week but has seen no further bleeding since.  (The hydrocortisone suppositories were too expensive for him) Tells me that prior to that he was constipated and has not been constipated since.  Describes that he has no abdominal or rectal pain.  Denies weight loss or family history of colon cancer.    Does admit to still using crack cocaine on a recreational basis.  He tells me he is aware it is a problem and is going to try to stop.  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    Denies fever, chills or weight loss.  Medical history: Back pain Crack cocaine use  Past Surgical History:  Procedure Laterality Date  . KNEE SURGERY      Current Outpatient Medications  Medication Sig Dispense Refill  . diclofenac (VOLTAREN) 75 MG EC tablet Take 75 mg by mouth 2 (two) times daily as needed.    . diclofenac Sodium (VOLTAREN) 1 % GEL Apply 1 application topically 4 (four) times daily.      . hydrocortisone (ANUSOL-HC) 25 MG suppository Place 1 suppository (25 mg total) rectally 2 (two) times daily. 12 suppository 0  . pantoprazole (PROTONIX) 20 MG tablet Take 1 tablet (20 mg total) by mouth daily. 30 tablet 0  . tiZANidine (ZANAFLEX) 4 MG tablet Take 4 mg by mouth 2 (two) times daily.    . traMADol (ULTRAM) 50 MG tablet Take 1 tablet (50 mg total) by mouth every 12 (twelve) hours as needed. 12 tablet 0  . Vitamin D, Ergocalciferol, (DRISDOL) 1.25 MG (50000 UNIT) CAPS capsule Take 50,000 Units by mouth once a week.     No current facility-administered medications for this visit.    Allergies as of 11/29/2019  . (No Known Allergies)    Family History  Problem Relation Age of Onset  . Healthy Mother   . Healthy Father     Social History   Socioeconomic History  . Marital status: Single    Spouse name: Not on file  . Number of children: Not on file  . Years of education: Not on file  . Highest education level: Not on file  Occupational History  . Not on file  Tobacco Use  . Smoking status: Current Every Day Smoker  . Smokeless tobacco: Never Used  Substance and Sexual Activity  . Alcohol use: Yes    Alcohol/week: 2.0 standard drinks  Types: 2 Standard drinks or equivalent per week  . Drug use: Yes    Types: Marijuana, Cocaine  . Sexual activity: Yes  Other Topics Concern  . Not on file  Social History Narrative  . Not on file   Social Determinants of Health   Financial Resource Strain:   . Difficulty of Paying Living Expenses: Not on file  Food Insecurity:   . Worried About Programme researcher, broadcasting/film/video in the Last Year: Not on file  . Ran Out of Food in the Last Year: Not on file  Transportation Needs:   . Lack of Transportation (Medical): Not on file  . Lack of Transportation (Non-Medical): Not on file  Physical Activity:   . Days of Exercise per Week: Not on file  . Minutes of Exercise per Session: Not on file  Stress:   . Feeling of Stress : Not on  file  Social Connections:   . Frequency of Communication with Friends and Family: Not on file  . Frequency of Social Gatherings with Friends and Family: Not on file  . Attends Religious Services: Not on file  . Active Member of Clubs or Organizations: Not on file  . Attends Banker Meetings: Not on file  . Marital Status: Not on file  Intimate Partner Violence:   . Fear of Current or Ex-Partner: Not on file  . Emotionally Abused: Not on file  . Physically Abused: Not on file  . Sexually Abused: Not on file    Review of Systems:    Constitutional: No weight loss, fever or chills Skin: No rash  Cardiovascular: No chest pain Respiratory: No SOB Gastrointestinal: See HPI and otherwise negative Genitourinary: No dysuria  Neurological: No headache, dizziness or syncope Musculoskeletal: No new muscle or joint pain Hematologic: No bruising Psychiatric: No history of depression or anxiety   Physical Exam:  Vital signs: BP 120/74 (BP Location: Left Arm, Patient Position: Sitting, Cuff Size: Normal)   Pulse 80   Ht 5\' 11"  (1.803 m) Comment: height measured without shoes  Wt 193 lb 2 oz (87.6 kg)   BMI 26.94 kg/m   Constitutional:   Pleasant AA male appears to be in NAD, Well developed, Well nourished, alert and cooperative Head:  Normocephalic and atraumatic. Eyes:   PEERL, EOMI. No icterus. Conjunctiva pink. Ears:  Normal auditory acuity. Neck:  Supple Throat: Oral cavity and pharynx without inflammation, swelling or lesion.  Respiratory: Respirations even and unlabored. Lungs clear to auscultation bilaterally.   No wheezes, crackles, or rhonchi.  Cardiovascular: Normal S1, S2. No MRG. Regular rate and rhythm. No peripheral edema, cyanosis or pallor.  Gastrointestinal:  Soft, nondistended, nontender. No rebound or guarding. Normal bowel sounds. No appreciable masses or hepatomegaly. Rectal:  Declined Msk:  Symmetrical without gross deformities. Without edema, no  deformity or joint abnormality.  Neurologic:  Alert and  oriented x4;  grossly normal neurologically.  Skin:   Dry and intact without significant lesions or rashes. Psychiatric: Demonstrates good judgement and reason without abnormal affect or behaviors.  RELEVANT LABS AND IMAGING: CBC    Component Value Date/Time   WBC 6.5 10/27/2019 1547   RBC 3.93 (L) 10/27/2019 1547   HGB 12.5 (L) 10/27/2019 1547   HCT 37.0 (L) 10/27/2019 1547   PLT 324 10/27/2019 1547   MCV 94.1 10/27/2019 1547   MCH 31.8 10/27/2019 1547   MCHC 33.8 10/27/2019 1547   RDW 12.9 10/27/2019 1547   LYMPHSABS 1.3 06/05/2019 0132   MONOABS 0.4  06/05/2019 0132   EOSABS 0.1 06/05/2019 0132   BASOSABS 0.1 06/05/2019 0132    CMP     Component Value Date/Time   NA 139 06/05/2019 0132   K 3.3 (L) 06/05/2019 0132   CL 106 06/05/2019 0132   CO2 20 (L) 06/05/2019 0132   GLUCOSE 141 (H) 06/05/2019 0132   BUN 11 06/05/2019 0132   CREATININE 0.94 06/05/2019 0132   CREATININE 1.09 03/10/2014 0931   CALCIUM 9.4 06/05/2019 0132   PROT 7.4 07/16/2010 2312   ALBUMIN 3.8 07/16/2010 2312   AST 18 07/16/2010 2312   ALT 17 07/16/2010 2312   ALKPHOS 87 07/16/2010 2312   BILITOT 0.4 07/16/2010 2312   GFRNONAA >60 06/05/2019 0132   GFRAA >60 06/05/2019 0132    Assessment: 1.  Rectal bleeding: For 24-36 hours of bright red blood per rectum, after a weeklong episode of constipation; consider hemorrhoids versus other 2.  Crack cocaine use: Patient tells me this is recreational  Plan: 1.  Discussed crack cocaine use with the patient.  He is telling me that he is trying to stop as he realizes this is an issue.  I congratulated him on being observant of his life and recognizing this is a problem.  If he would like help with this I can direct him to the right resources.  Did tell him not to use crack cocaine for the 2 days leading up to the procedure or the day of the procedure itself. 2.  Scheduled patient for a diagnostic  colonoscopy due to rectal bleeding with Dr. Christella Hartigan in the [facility].  Did discuss risks, benefits, limitations and alternatives and patient agrees to proceed.  He tells me he has had both of his Covid vaccines. 3.  Patient to follow in clinic per recommendations from Dr. Christella Hartigan after time of procedure.  Hyacinth Meeker, PA-C [facility] 11/29/2019, 9:14 AM  Cc: Fleet Contras, MD

## 2019-11-29 NOTE — Progress Notes (Signed)
I agree with the above note, plan 

## 2019-11-29 NOTE — Patient Instructions (Signed)
If you are age 61 or older, your body mass index should be between 23-30. Your Body mass index is 26.94 kg/m. If this is out of the aforementioned range listed, please consider follow up with your Primary Care Provider.  If you are age 76 or younger, your body mass index should be between 19-25. Your Body mass index is 26.94 kg/m. If this is out of the aformentioned range listed, please consider follow up with your Primary Care Provider.   You have been scheduled for a colonoscopy. Please follow written instructions given to you at your visit today.  Please pick up your prep supplies at the pharmacy within the next 1-3 days. If you use inhalers (even only as needed), please bring them with you on the day of your procedure.  Due to recent changes in healthcare laws, you may see the results of your imaging and laboratory studies on MyChart before your provider has had a chance to review them.  We understand that in some cases there may be results that are confusing or concerning to you. Not all laboratory results come back in the same time frame and the provider may be waiting for multiple results in order to interpret others.  Please give [name] 48 hours in order for your provider to thoroughly review all the results before contacting the office for clarification of your results.

## 2019-12-31 ENCOUNTER — Encounter: Payer: Self-pay | Admitting: Gastroenterology

## 2020-03-04 ENCOUNTER — Ambulatory Visit (HOSPITAL_COMMUNITY)
Admission: EM | Admit: 2020-03-04 | Discharge: 2020-03-04 | Disposition: A | Discharge status: Home / Self Care | Payer: Medicare Other | Attending: Family Medicine | Admitting: Family Medicine

## 2020-03-04 ENCOUNTER — Other Ambulatory Visit: Payer: Self-pay

## 2020-03-04 DIAGNOSIS — Z20822 Contact with and (suspected) exposure to covid-19: Secondary | ICD-10-CM | POA: Diagnosis present

## 2020-03-04 DIAGNOSIS — M545 Low back pain, unspecified: Secondary | ICD-10-CM | POA: Diagnosis not present

## 2020-03-04 DIAGNOSIS — U071 COVID-19: Secondary | ICD-10-CM | POA: Insufficient documentation

## 2020-03-04 LAB — SARS CORONAVIRUS 2 (TAT 6-24 HRS): SARS Coronavirus 2: POSITIVE — AB

## 2020-03-04 MED ORDER — DEXAMETHASONE SODIUM PHOSPHATE 10 MG/ML IJ SOLN
10.0000 mg | Freq: Once | INTRAMUSCULAR | Status: AC
  Administered 2020-03-04: 18:00:00 10 mg via INTRAMUSCULAR

## 2020-03-04 MED ORDER — DEXAMETHASONE SODIUM PHOSPHATE 10 MG/ML IJ SOLN
INTRAMUSCULAR | Status: AC
  Filled 2020-03-04: qty 1

## 2020-03-04 NOTE — ED Triage Notes (Signed)
On  Arrival to Pt room  He requested to see a DR for his back pain . Will change acuity and  Complaint.

## 2020-03-04 NOTE — ED Triage Notes (Signed)
Pt reports he has chronic back pain and wanted a steroid  Shot and a COVID test.
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
## 2020-03-04 NOTE — Discharge Instructions (Addendum)
You have been tested for COVID-19 today. If your test returns positive, you will receive a phone call from [facility] regarding your results. Negative test results are not called. Both positive and negative results area always visible on MyChart. If you do not have a MyChart account, sign up instructions are provided in your discharge papers. Please do not hesitate to contact us should you have questions or concerns.  HOME CARE INSTRUCTIONS: For many people, back pain returns. Since low back pain is rarely dangerous, it is often a condition that people can learn to manage on their own. Please remain active. It is stressful on the back to sit or stand in one place. Do not sit, drive, or stand in one place for more than 30 minutes at a time. Take short walks on level surfaces as soon as pain allows. Try to increase the length of time you walk each day. Do not stay in bed. Resting more than 1 or 2 days can delay your recovery. Do not avoid exercise or work. Your body is made to move. It is not dangerous to be active, even though your back may hurt. Your back will likely heal faster if you return to being active before your pain is gone. Over-the-counter medicines to reduce pain and inflammation are often the most helpful.  SEEK MEDICAL CARE IF: You have pain that is not relieved with rest or medicine. You have pain that does not improve in 1 week. You have new symptoms. You are generally not feeling well.  SEEK IMMEDIATE MEDICAL CARE IF: You have pain that radiates from your back into your legs. You develop new bowel or bladder control problems. You have unusual weakness or numbness in your arms or legs. You develop nausea or vomiting. You develop abdominal pain. You feel faint.

## 2020-03-05 ENCOUNTER — Telehealth: Payer: Self-pay | Admitting: Nurse Practitioner

## 2020-03-05 NOTE — Telephone Encounter (Signed)
I called Derek Duran to discuss Covid symptoms and the use of a monoclonal antibody infusion for those with mild to moderate Covid symptoms and at a high risk of hospitalization.    Pt does not qualify for infusion therapy as pt has asymptomatic infection. Isolation precautions discussed. Advised to contact back for consideration should they develop symptoms. Patient verbalized understanding.     [name], NP

## 2020-05-04 ENCOUNTER — Encounter: Payer: Self-pay | Admitting: Orthopaedic Surgery

## 2020-05-04 ENCOUNTER — Other Ambulatory Visit: Payer: Self-pay

## 2020-05-04 ENCOUNTER — Ambulatory Visit (INDEPENDENT_AMBULATORY_CARE_PROVIDER_SITE_OTHER): Payer: Medicare HMO | Admitting: Orthopaedic Surgery

## 2020-05-04 VITALS — Ht 71.0 in | Wt 194.0 lb

## 2020-05-04 DIAGNOSIS — M4807 Spinal stenosis, lumbosacral region: Secondary | ICD-10-CM

## 2020-05-04 DIAGNOSIS — M5441 Lumbago with sciatica, right side: Secondary | ICD-10-CM | POA: Diagnosis not present

## 2020-05-04 DIAGNOSIS — G8929 Other chronic pain: Secondary | ICD-10-CM | POA: Diagnosis not present
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
## 2020-05-13 ENCOUNTER — Other Ambulatory Visit: Payer: Self-pay | Admitting: Orthopaedic Surgery

## 2020-05-13 DIAGNOSIS — Z77018 Contact with and (suspected) exposure to other hazardous metals: Secondary | ICD-10-CM

## 2020-05-29 ENCOUNTER — Other Ambulatory Visit: Payer: Self-pay | Admitting: Pediatrics

## 2020-05-29 ENCOUNTER — Telehealth: Payer: Self-pay | Admitting: Orthopaedic Surgery

## 2020-05-29 NOTE — Telephone Encounter (Signed)
Called pt left 1x vm for patient to call and set an MRI Review appt with Dr. [name] or PA [name] after 3/28. Will try again later.

## 2020-06-01 ENCOUNTER — Other Ambulatory Visit: Payer: Self-pay | Admitting: Orthopaedic Surgery

## 2020-06-01 ENCOUNTER — Ambulatory Visit
Admission: RE | Admit: 2020-06-01 | Discharge: 2020-06-01 | Disposition: A | Discharge status: Home / Self Care | Payer: Medicare HMO | Source: Ambulatory Visit | Attending: Orthopaedic Surgery | Admitting: Orthopaedic Surgery

## 2020-06-01 ENCOUNTER — Other Ambulatory Visit: Payer: Medicare HMO

## 2020-06-01 ENCOUNTER — Other Ambulatory Visit: Payer: Self-pay

## 2020-06-01 DIAGNOSIS — M4807 Spinal stenosis, lumbosacral region: Secondary | ICD-10-CM

## 2020-06-08 ENCOUNTER — Ambulatory Visit (INDEPENDENT_AMBULATORY_CARE_PROVIDER_SITE_OTHER): Payer: Medicare HMO | Admitting: Orthopaedic Surgery

## 2020-06-08 ENCOUNTER — Encounter: Payer: Self-pay | Admitting: Orthopaedic Surgery

## 2020-06-08 ENCOUNTER — Other Ambulatory Visit: Payer: Self-pay

## 2020-06-08 DIAGNOSIS — M4807 Spinal stenosis, lumbosacral region: Secondary | ICD-10-CM

## 2020-06-08 NOTE — Progress Notes (Signed)
The patient is a 60 year old gentleman who comes in today to go over the MRI of his lumbar spine.  His back pain is affecting his posture significantly.  He does stand a lot at work and it does involve significant lifting.  We will send him for an MRI due to a lot of pain he was having.  There is occasionally radicular symptoms going down his legs.  He still walks with poor posture leaning forward.  The MRI of his lumbar spine shows severe spinal canal and moderate to severe bilateral neuroforaminal narrowing at L2-L3, L3-L4, and L4-L5 levels.  He also has narrowing at L5-S1.  All of these levels can be contributing to his back pain.  He has good strength in his bilateral lower extremities today my exam.  Most of his pain seems to be in the back.  It is essential that we try outpatient physical therapy that can help with core muscle strengthening and posture.  Eventually he may need some type of facet joint injections or ESI but we will try physical therapy first.  He agrees with that treatment plan.  I will see him back in 6 weeks.

## 2020-07-02 ENCOUNTER — Other Ambulatory Visit: Payer: Self-pay

## 2020-07-02 ENCOUNTER — Ambulatory Visit: Payer: Medicare HMO | Attending: Orthopaedic Surgery

## 2020-07-02 DIAGNOSIS — M6281 Muscle weakness (generalized): Secondary | ICD-10-CM | POA: Diagnosis not present

## 2020-07-02 DIAGNOSIS — R2689 Other abnormalities of gait and mobility: Secondary | ICD-10-CM | POA: Insufficient documentation

## 2020-07-02 DIAGNOSIS — R2681 Unsteadiness on feet: Secondary | ICD-10-CM | POA: Insufficient documentation

## 2020-07-02 DIAGNOSIS — G8929 Other chronic pain: Secondary | ICD-10-CM | POA: Diagnosis present

## 2020-07-02 DIAGNOSIS — M545 Low back pain, unspecified: Secondary | ICD-10-CM | POA: Insufficient documentation
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
## 2020-07-15 ENCOUNTER — Encounter: Payer: Self-pay | Admitting: Physical Therapy

## 2020-07-15 ENCOUNTER — Ambulatory Visit: Payer: Medicare HMO | Attending: Orthopaedic Surgery | Admitting: Physical Therapy

## 2020-07-15 ENCOUNTER — Other Ambulatory Visit: Payer: Self-pay

## 2020-07-15 DIAGNOSIS — R2681 Unsteadiness on feet: Secondary | ICD-10-CM | POA: Diagnosis present

## 2020-07-15 DIAGNOSIS — M545 Low back pain, unspecified: Secondary | ICD-10-CM | POA: Insufficient documentation

## 2020-07-15 DIAGNOSIS — M6281 Muscle weakness (generalized): Secondary | ICD-10-CM | POA: Diagnosis not present

## 2020-07-15 DIAGNOSIS — G8929 Other chronic pain: Secondary | ICD-10-CM | POA: Diagnosis present

## 2020-07-15 DIAGNOSIS — R2689 Other abnormalities of gait and mobility: Secondary | ICD-10-CM | POA: Insufficient documentation
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
## 2020-07-16 ENCOUNTER — Ambulatory Visit: Payer: Medicare HMO | Admitting: Physical Therapy

## 2020-07-16 ENCOUNTER — Encounter: Payer: Self-pay | Admitting: Physical Therapy

## 2020-07-16 DIAGNOSIS — R2681 Unsteadiness on feet: Secondary | ICD-10-CM

## 2020-07-16 DIAGNOSIS — M6281 Muscle weakness (generalized): Secondary | ICD-10-CM

## 2020-07-16 DIAGNOSIS — R2689 Other abnormalities of gait and mobility: Secondary | ICD-10-CM

## 2020-07-16 DIAGNOSIS — G8929 Other chronic pain: Secondary | ICD-10-CM
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
## 2020-07-21 ENCOUNTER — Other Ambulatory Visit: Payer: Self-pay

## 2020-07-21 ENCOUNTER — Ambulatory Visit: Payer: Medicare HMO | Admitting: Physical Therapy

## 2020-07-21 ENCOUNTER — Encounter: Payer: Self-pay | Admitting: Physical Therapy

## 2020-07-21 DIAGNOSIS — M6281 Muscle weakness (generalized): Secondary | ICD-10-CM

## 2020-07-21 DIAGNOSIS — R2681 Unsteadiness on feet: Secondary | ICD-10-CM

## 2020-07-21 DIAGNOSIS — R2689 Other abnormalities of gait and mobility: Secondary | ICD-10-CM

## 2020-07-21 DIAGNOSIS — M545 Low back pain, unspecified: Secondary | ICD-10-CM
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
## 2020-07-22 ENCOUNTER — Ambulatory Visit: Payer: Medicare HMO | Admitting: Orthopaedic Surgery

## 2020-07-23 ENCOUNTER — Other Ambulatory Visit: Payer: Self-pay

## 2020-07-23 ENCOUNTER — Ambulatory Visit: Payer: Medicare HMO | Admitting: Physical Therapy

## 2020-07-23 DIAGNOSIS — M6281 Muscle weakness (generalized): Secondary | ICD-10-CM

## 2020-07-23 DIAGNOSIS — G8929 Other chronic pain: Secondary | ICD-10-CM

## 2020-07-23 DIAGNOSIS — R2689 Other abnormalities of gait and mobility: Secondary | ICD-10-CM

## 2020-07-23 DIAGNOSIS — R2681 Unsteadiness on feet: Secondary | ICD-10-CM
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
## 2020-07-28 ENCOUNTER — Encounter: Payer: Self-pay | Admitting: Physical Therapy

## 2020-07-28 ENCOUNTER — Ambulatory Visit: Payer: Medicare HMO | Admitting: Physical Therapy

## 2020-07-28 ENCOUNTER — Other Ambulatory Visit: Payer: Self-pay

## 2020-07-28 DIAGNOSIS — M6281 Muscle weakness (generalized): Secondary | ICD-10-CM | POA: Diagnosis not present

## 2020-07-28 DIAGNOSIS — R2681 Unsteadiness on feet: Secondary | ICD-10-CM

## 2020-07-28 DIAGNOSIS — R2689 Other abnormalities of gait and mobility: Secondary | ICD-10-CM

## 2020-07-28 DIAGNOSIS — G8929 Other chronic pain: Secondary | ICD-10-CM
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
## 2020-07-30 ENCOUNTER — Other Ambulatory Visit: Payer: Self-pay

## 2020-07-30 ENCOUNTER — Encounter: Payer: Self-pay | Admitting: Physical Therapy

## 2020-07-30 ENCOUNTER — Ambulatory Visit: Payer: Medicare HMO | Admitting: Physical Therapy

## 2020-07-30 DIAGNOSIS — M545 Low back pain, unspecified: Secondary | ICD-10-CM

## 2020-07-30 DIAGNOSIS — M6281 Muscle weakness (generalized): Secondary | ICD-10-CM

## 2020-07-30 DIAGNOSIS — R2689 Other abnormalities of gait and mobility: Secondary | ICD-10-CM

## 2020-07-30 DIAGNOSIS — G8929 Other chronic pain: Secondary | ICD-10-CM

## 2020-07-30 DIAGNOSIS — R2681 Unsteadiness on feet: Secondary | ICD-10-CM
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
## 2020-08-04 ENCOUNTER — Ambulatory Visit: Payer: Medicare HMO | Admitting: Physical Therapy

## 2020-08-04 ENCOUNTER — Other Ambulatory Visit: Payer: Self-pay

## 2020-08-04 ENCOUNTER — Encounter: Payer: Self-pay | Admitting: Physical Therapy

## 2020-08-04 DIAGNOSIS — M6281 Muscle weakness (generalized): Secondary | ICD-10-CM

## 2020-08-04 DIAGNOSIS — R2681 Unsteadiness on feet: Secondary | ICD-10-CM

## 2020-08-04 DIAGNOSIS — R2689 Other abnormalities of gait and mobility: Secondary | ICD-10-CM

## 2020-08-04 DIAGNOSIS — G8929 Other chronic pain: Secondary | ICD-10-CM
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
## 2020-08-06 ENCOUNTER — Other Ambulatory Visit: Payer: Self-pay

## 2020-08-06 ENCOUNTER — Encounter: Payer: Self-pay | Admitting: Physical Therapy

## 2020-08-06 ENCOUNTER — Ambulatory Visit: Payer: Medicare HMO | Attending: Orthopaedic Surgery | Admitting: Physical Therapy

## 2020-08-06 DIAGNOSIS — R2681 Unsteadiness on feet: Secondary | ICD-10-CM | POA: Diagnosis present

## 2020-08-06 DIAGNOSIS — R2689 Other abnormalities of gait and mobility: Secondary | ICD-10-CM | POA: Insufficient documentation

## 2020-08-06 DIAGNOSIS — G8929 Other chronic pain: Secondary | ICD-10-CM | POA: Insufficient documentation

## 2020-08-06 DIAGNOSIS — M545 Low back pain, unspecified: Secondary | ICD-10-CM | POA: Insufficient documentation

## 2020-08-06 DIAGNOSIS — M6281 Muscle weakness (generalized): Secondary | ICD-10-CM | POA: Diagnosis present
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
## 2020-08-11 ENCOUNTER — Ambulatory Visit: Payer: Medicare HMO | Admitting: Physical Therapy

## 2020-08-11 ENCOUNTER — Other Ambulatory Visit: Payer: Self-pay

## 2020-08-11 ENCOUNTER — Encounter: Payer: Self-pay | Admitting: Physical Therapy

## 2020-08-11 DIAGNOSIS — R2681 Unsteadiness on feet: Secondary | ICD-10-CM

## 2020-08-11 DIAGNOSIS — G8929 Other chronic pain: Secondary | ICD-10-CM

## 2020-08-11 DIAGNOSIS — M6281 Muscle weakness (generalized): Secondary | ICD-10-CM | POA: Diagnosis not present

## 2020-08-11 DIAGNOSIS — R2689 Other abnormalities of gait and mobility: Secondary | ICD-10-CM
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
## 2020-08-13 ENCOUNTER — Ambulatory Visit: Payer: Medicare HMO

## 2020-08-18 ENCOUNTER — Encounter: Payer: Medicare HMO | Admitting: Physical Therapy

## 2020-08-20 ENCOUNTER — Encounter: Payer: Medicare HMO | Admitting: Physical Therapy

## 2020-08-25 ENCOUNTER — Encounter: Payer: Medicare HMO | Admitting: Physical Therapy

## 2020-10-28 ENCOUNTER — Other Ambulatory Visit: Payer: Self-pay

## 2020-10-28 ENCOUNTER — Encounter (HOSPITAL_COMMUNITY): Payer: Self-pay

## 2020-10-28 ENCOUNTER — Ambulatory Visit (HOSPITAL_COMMUNITY)
Admission: EM | Admit: 2020-10-28 | Discharge: 2020-10-28 | Disposition: A | Discharge status: Home / Self Care | Payer: Medicare HMO | Attending: Medical Oncology | Admitting: Medical Oncology

## 2020-10-28 DIAGNOSIS — M25572 Pain in left ankle and joints of left foot: Secondary | ICD-10-CM | POA: Diagnosis not present

## 2020-10-28 MED ORDER — COLCHICINE 0.6 MG PO TABS: ORAL_TABLET | ORAL | 0 refills | Status: DC

## 2020-10-28 NOTE — ED Triage Notes (Signed)
Pt reports left foot swelling x 2 days; tingling in the left index finger, middle finger and ring finger x 3 months.
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
## 2020-12-09 ENCOUNTER — Other Ambulatory Visit: Payer: Self-pay | Admitting: Internal Medicine

## 2020-12-10 LAB — LIPID PANEL
Cholesterol: 187 mg/dL (ref <200)
HDL: 39 mg/dL — ABNORMAL LOW (ref >=40)
LDL Cholesterol (Calc): 116 mg/dL (calc) — ABNORMAL HIGH
Non-HDL Cholesterol (Calc): 148 mg/dL (calc) — ABNORMAL HIGH (ref <130)
Total CHOL/HDL Ratio: 4.8 (calc) (ref <5.0)
Triglycerides: 208 mg/dL — ABNORMAL HIGH (ref <150)

## 2020-12-10 LAB — CBC
HCT: 42.7 % (ref 38.5–50.0)
Hemoglobin: 14.6 g/dL (ref 13.2–17.1)
MCH: 31.6 pg (ref 27.0–33.0)
MCHC: 34.2 g/dL (ref 32.0–36.0)
MCV: 92.4 fL (ref 80.0–100.0)
MPV: 9.6 fL (ref 7.5–12.5)
Platelets: 357 10*3/uL (ref 140–400)
RBC: 4.62 10*6/uL (ref 4.20–5.80)
RDW: 13.2 % (ref 11.0–15.0)
WBC: 5.9 10*3/uL (ref 3.8–10.8)

## 2020-12-10 LAB — COMPLETE METABOLIC PANEL WITH GFR
AG Ratio: 1.5 (calc) (ref 1.0–2.5)
ALT: 13 U/L (ref 9–46)
AST: 17 U/L (ref 10–35)
Albumin: 4.3 g/dL (ref 3.6–5.1)
Alkaline phosphatase (APISO): 94 U/L (ref 35–144)
BUN: 16 mg/dL (ref 7–25)
CO2: 23 mmol/L (ref 20–32)
Calcium: 9.6 mg/dL (ref 8.6–10.3)
Chloride: 104 mmol/L (ref 98–110)
Creat: 1.06 mg/dL (ref 0.70–1.35)
Globulin: 2.9 g/dL (calc) (ref 1.9–3.7)
Glucose, Bld: 85 mg/dL (ref 65–99)
Potassium: 3.7 mmol/L (ref 3.5–5.3)
Sodium: 139 mmol/L (ref 135–146)
Total Bilirubin: 0.7 mg/dL (ref 0.2–1.2)
Total Protein: 7.2 g/dL (ref 6.1–8.1)
eGFR: 79 mL/min/{1.73_m2} (ref >=60)

## 2020-12-10 LAB — URIC ACID: Uric Acid, Serum: 7.9 mg/dL (ref 4.0–8.0)

## 2020-12-10 LAB — PSA: PSA: 1.45 ng/mL (ref <=4.00)

## 2020-12-10 LAB — TSH: TSH: 1.23 mIU/L (ref 0.40–4.50)

## 2020-12-10 LAB — VITAMIN D 25 HYDROXY (VIT D DEFICIENCY, FRACTURES): Vit D, 25-Hydroxy: 13 ng/mL — ABNORMAL LOW (ref 30–100)

## 2021-09-22 ENCOUNTER — Encounter (HOSPITAL_COMMUNITY): Payer: Self-pay | Admitting: Emergency Medicine

## 2021-09-22 ENCOUNTER — Ambulatory Visit (INDEPENDENT_AMBULATORY_CARE_PROVIDER_SITE_OTHER): Payer: Medicare Other

## 2021-09-22 ENCOUNTER — Ambulatory Visit (HOSPITAL_COMMUNITY)
Admission: EM | Admit: 2021-09-22 | Discharge: 2021-09-22 | Disposition: A | Discharge status: Home / Self Care | Payer: Medicare Other | Attending: Family Medicine | Admitting: Family Medicine

## 2021-09-22 DIAGNOSIS — M25512 Pain in left shoulder: Secondary | ICD-10-CM

## 2021-09-22 MED ORDER — KETOROLAC TROMETHAMINE 30 MG/ML IJ SOLN
30.0000 mg | Freq: Once | INTRAMUSCULAR | Status: AC
  Administered 2021-09-22: 30 mg via INTRAMUSCULAR

## 2021-09-22 MED ORDER — IBUPROFEN 800 MG PO TABS: 800.0000 mg | ORAL_TABLET | Freq: Three times a day (TID) | ORAL | 0 refills | Status: DC | PRN

## 2021-09-22 MED ORDER — KETOROLAC TROMETHAMINE 30 MG/ML IJ SOLN
INTRAMUSCULAR | Status: AC
  Filled 2021-09-22: qty 1

## 2021-09-22 NOTE — ED Triage Notes (Signed)
Pt c/o left shoulder pain that started last week. Tried picking something up today at work and to painful. Reports working out and can hear noises in shoulder. Reports knots have come up on bilat shoulders as well

## 2021-09-22 NOTE — Discharge Instructions (Addendum)
By my review, your x-rays do not show any acute bony problem.  I do think there are some degenerative changes like arthritis there.  You may have a muscular problem.  I will call you if there is anything significant on the x-ray once the radiologist reads it  Take ibuprofen 800 mg--1 tab every 8 hours as needed for pain.  You have been given a shot of Toradol 30 mg today.
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
## 2021-10-15 ENCOUNTER — Other Ambulatory Visit: Payer: Self-pay

## 2021-10-15 ENCOUNTER — Ambulatory Visit (HOSPITAL_COMMUNITY)
Admission: EM | Admit: 2021-10-15 | Discharge: 2021-10-15 | Disposition: A | Discharge status: Home / Self Care | Payer: Medicare Other

## 2021-10-15 ENCOUNTER — Encounter (HOSPITAL_COMMUNITY): Payer: Self-pay | Admitting: Emergency Medicine

## 2021-10-15 DIAGNOSIS — M545 Low back pain, unspecified: Secondary | ICD-10-CM | POA: Diagnosis not present

## 2021-10-15 DIAGNOSIS — M542 Cervicalgia: Secondary | ICD-10-CM | POA: Diagnosis not present

## 2021-10-15 NOTE — Discharge Instructions (Addendum)
I recommend trying ibuprofen every 6 hours for pain. You may have some muscular pain for the next day or two. It should start to get better with stretching, ibuprofen, and hot pad.  Please go to the emergency department if symptoms worsen.
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
## 2021-11-20 IMAGING — CR DG WRIST COMPLETE 3+V*L*
3 series · 3 of 3 positions shown · non-contrast
Comparison: None.

CLINICAL DATA: Dog bites

EXAM:
LEFT WRIST - COMPLETE 3+ VIEW

[wrist pa]
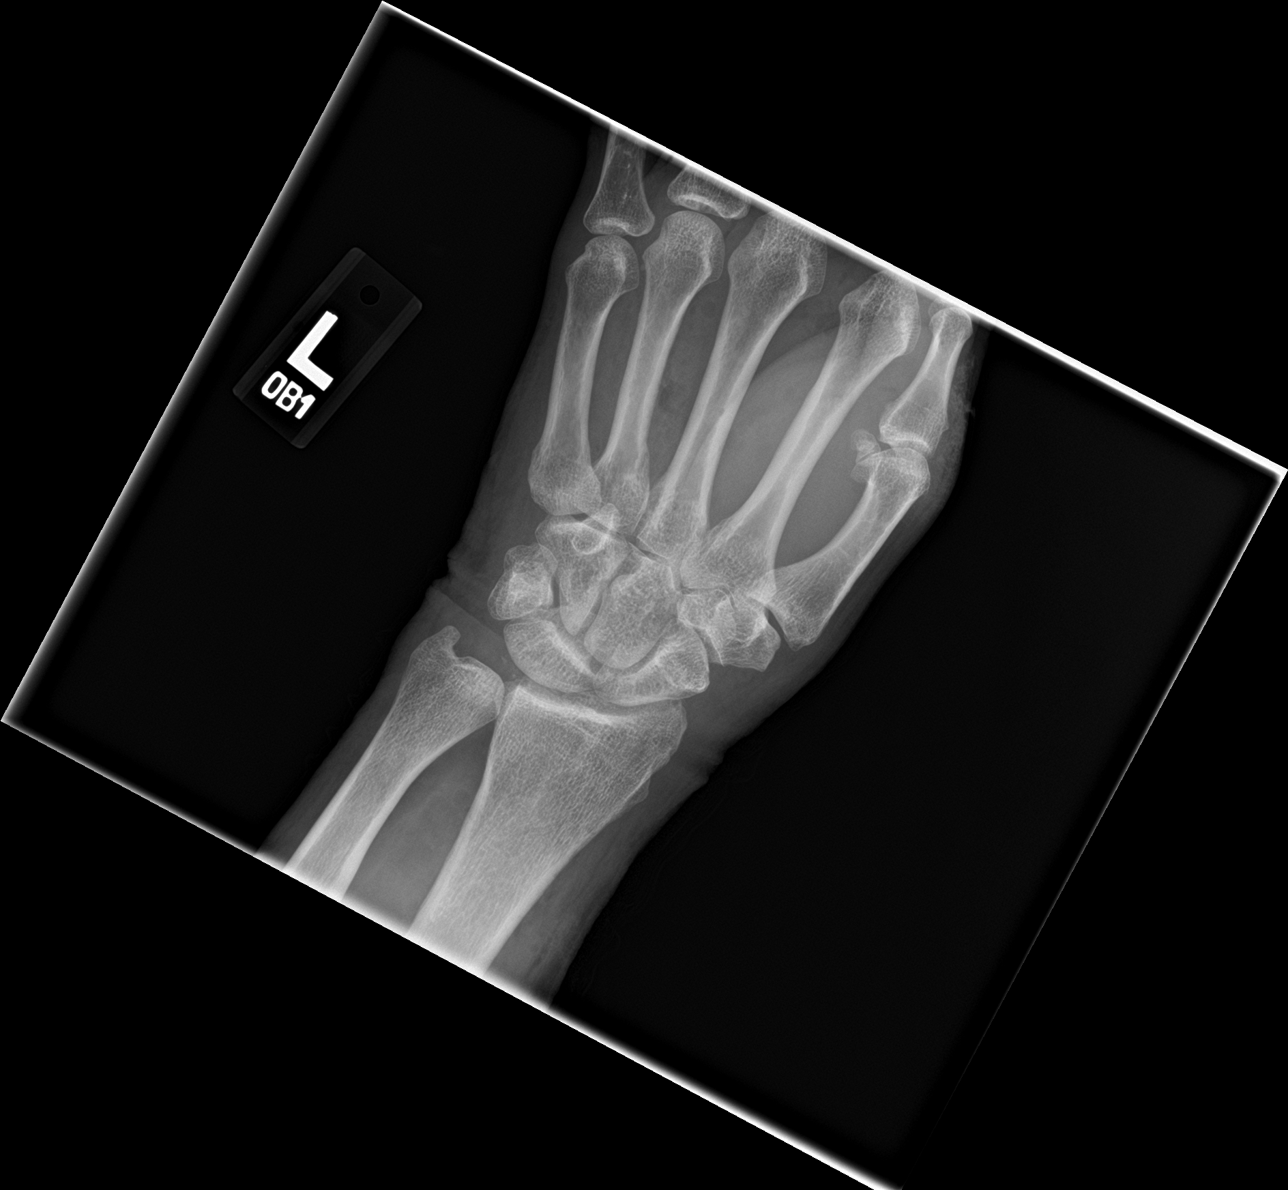

[wrist obl]
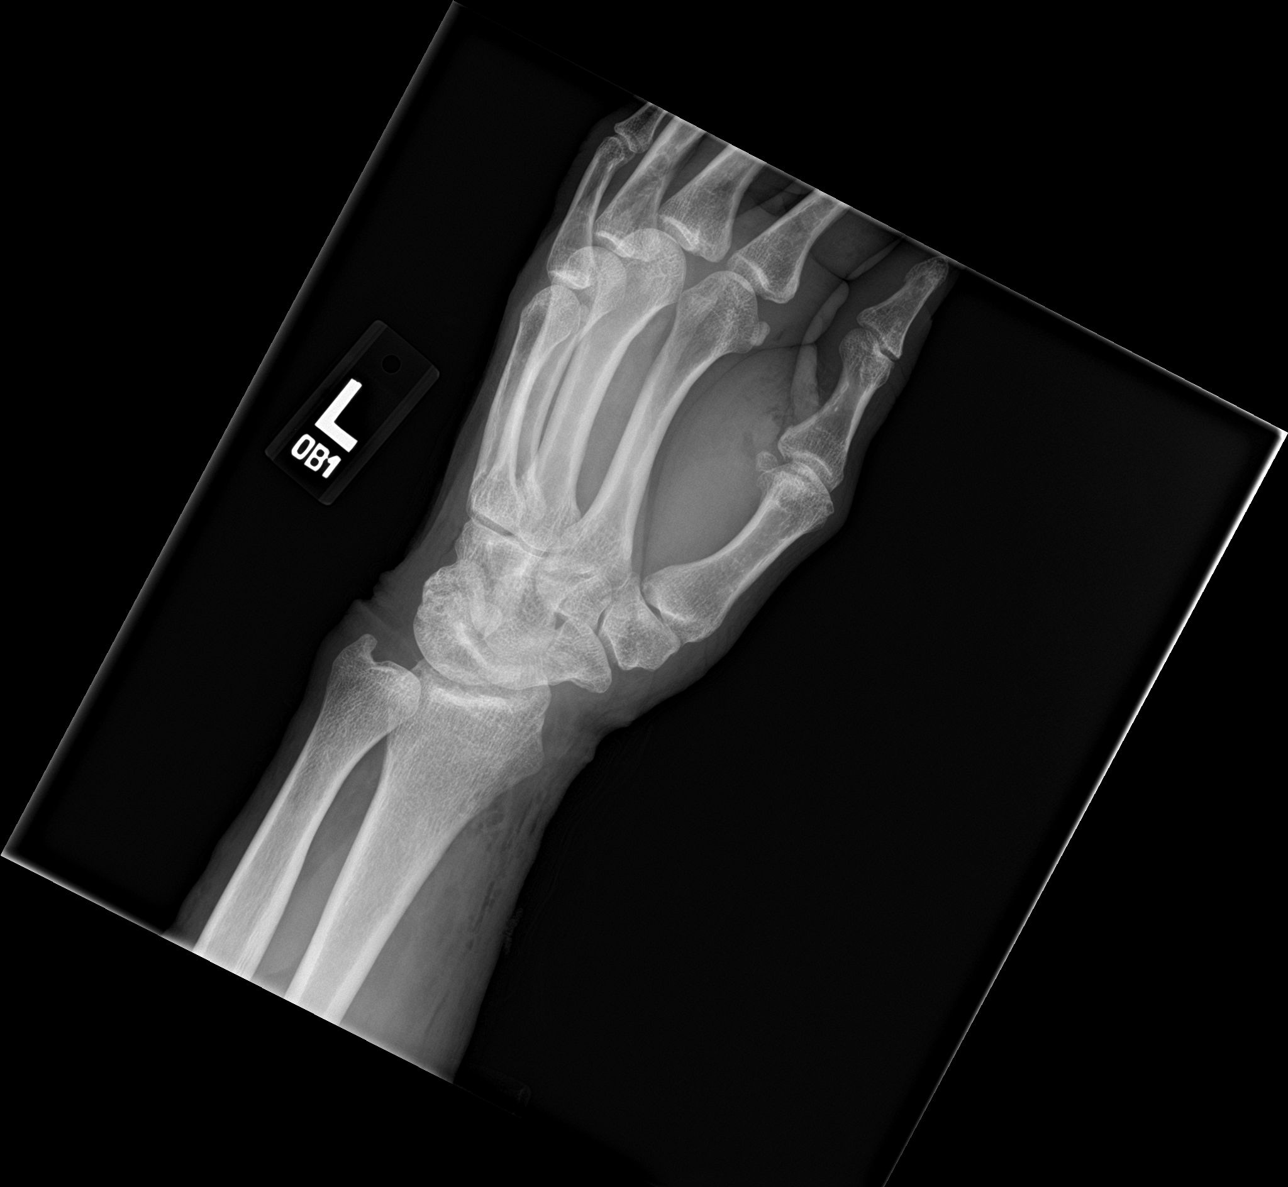

[wrist lat]
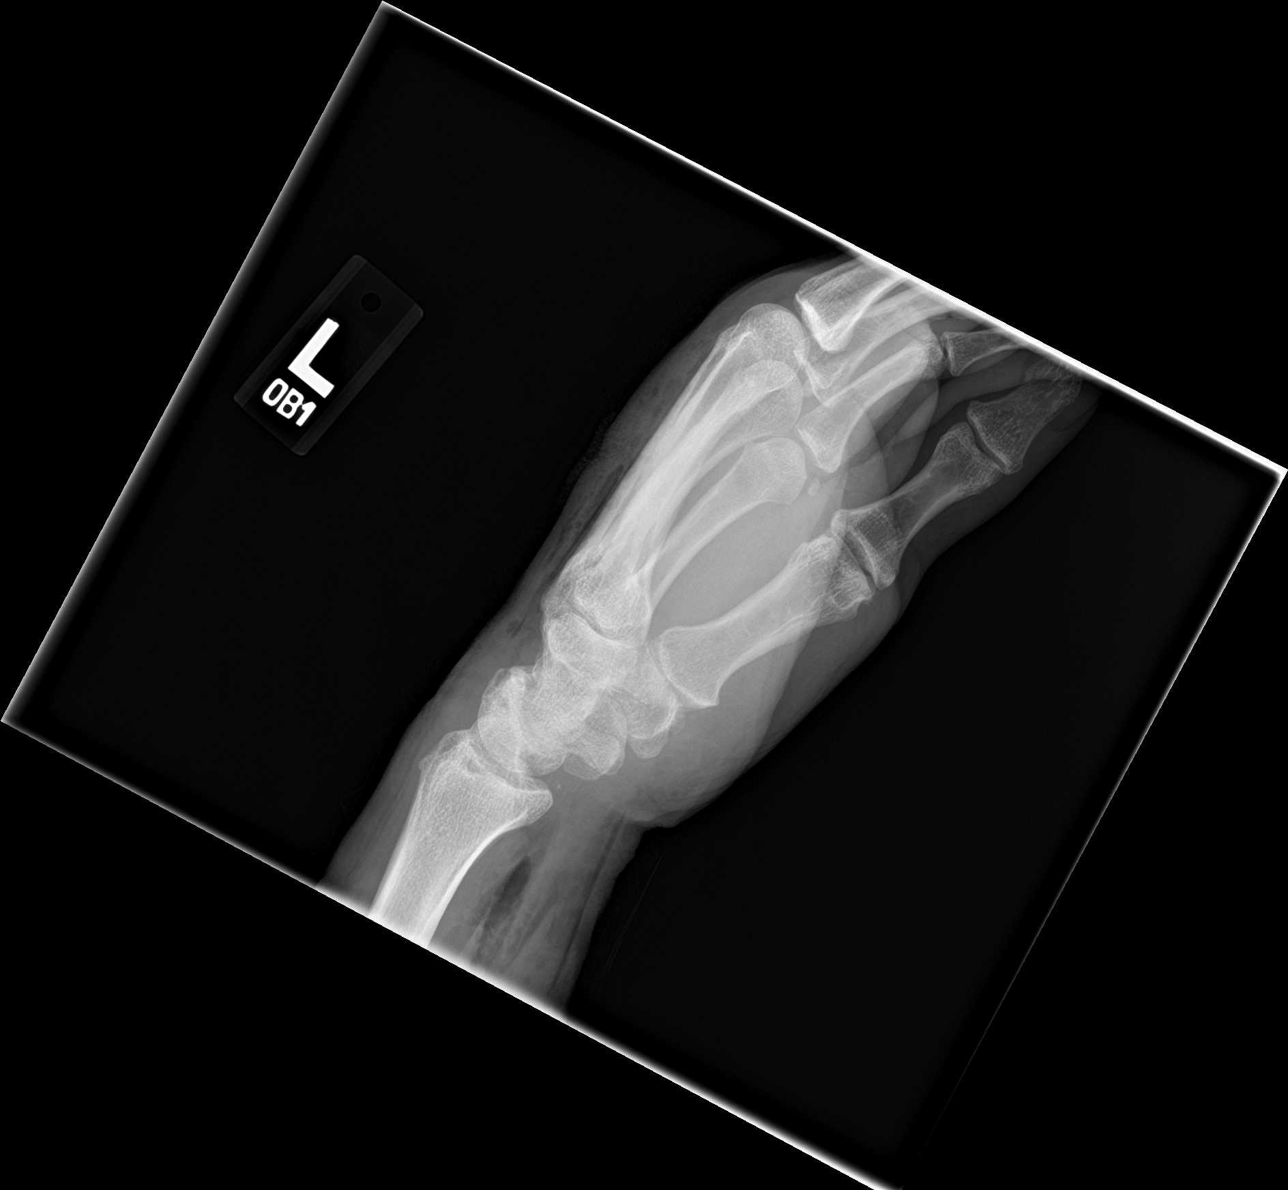

[3 of 3 positions shown; findings below may reference images not displayed]

FINDINGS: No fracture or dislocation is seen.

Mild joint space narrowing at the radiocarpal articulation.

Multiple soft tissue lacerations along the ventral aspect of the
wrist and dorsal aspect of the hand, corresponding to the patient's
known dog bites. Associated mild dorsal soft tissue swelling.

No radiopaque foreign body is seen.
IMPRESSION: Dorsal soft tissue swelling with multiple soft tissue lacerations.

No fracture, dislocation, or radiopaque foreign body is seen.

## 2021-11-20 IMAGING — CR DG HAND COMPLETE 3+V*R*
3 series · 3 of 3 positions shown · non-contrast
Comparison: None.

CLINICAL DATA: Dog bites

EXAM:
RIGHT HAND - COMPLETE 3+ VIEW

[hand pa]
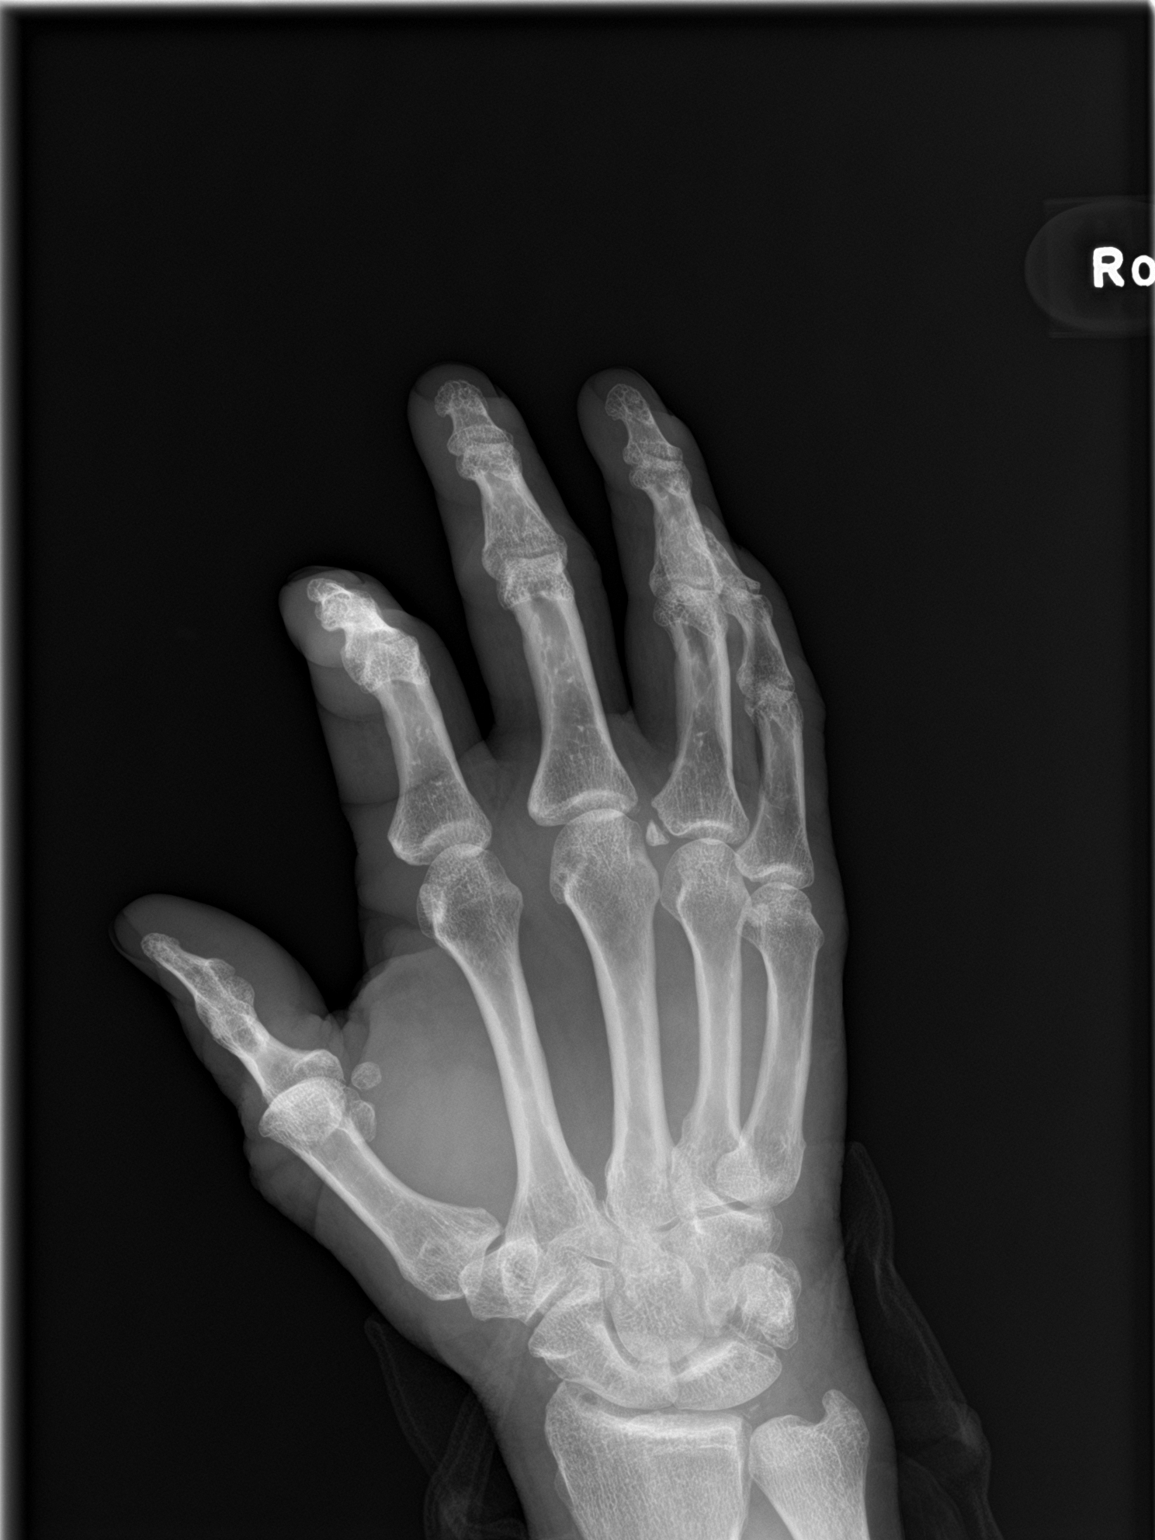

[hand obl]
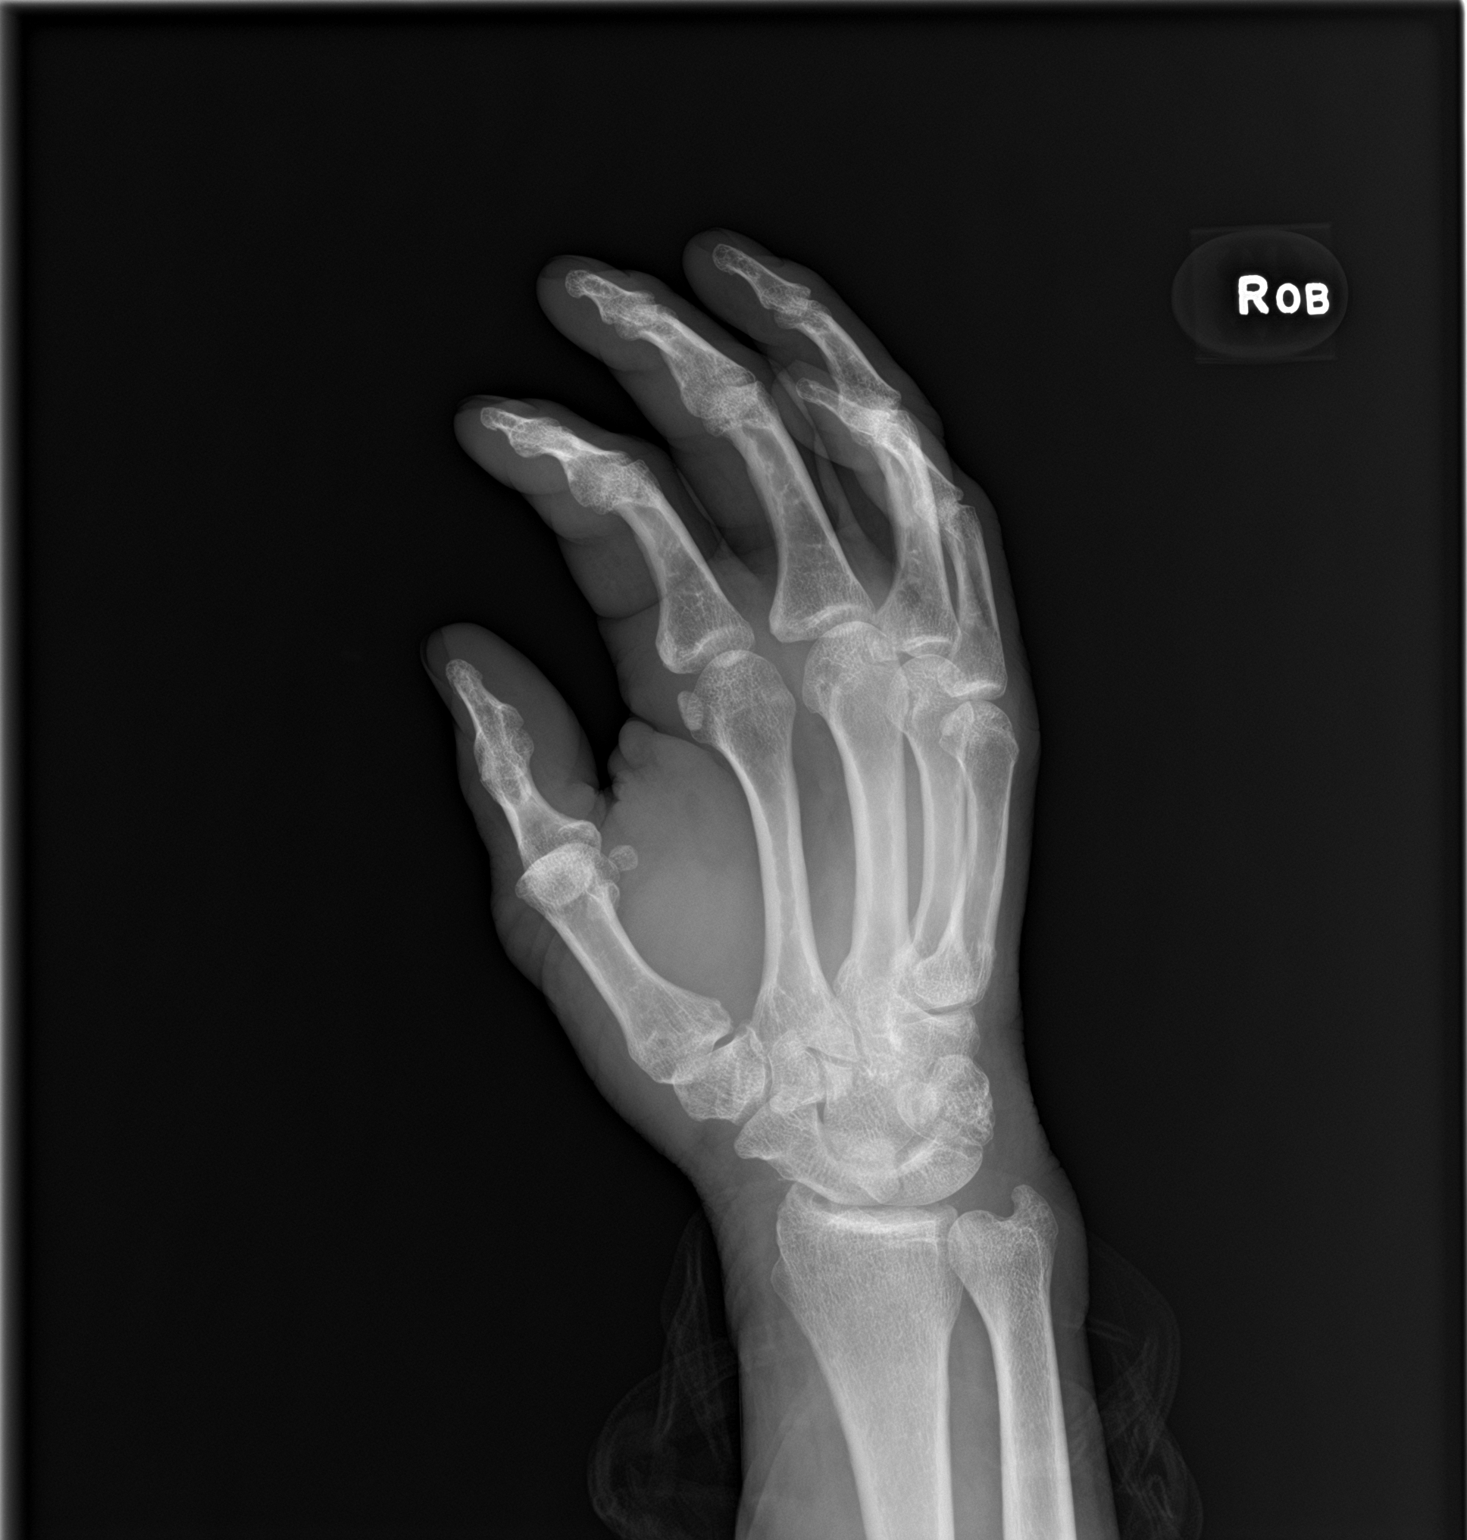

[hand lat]
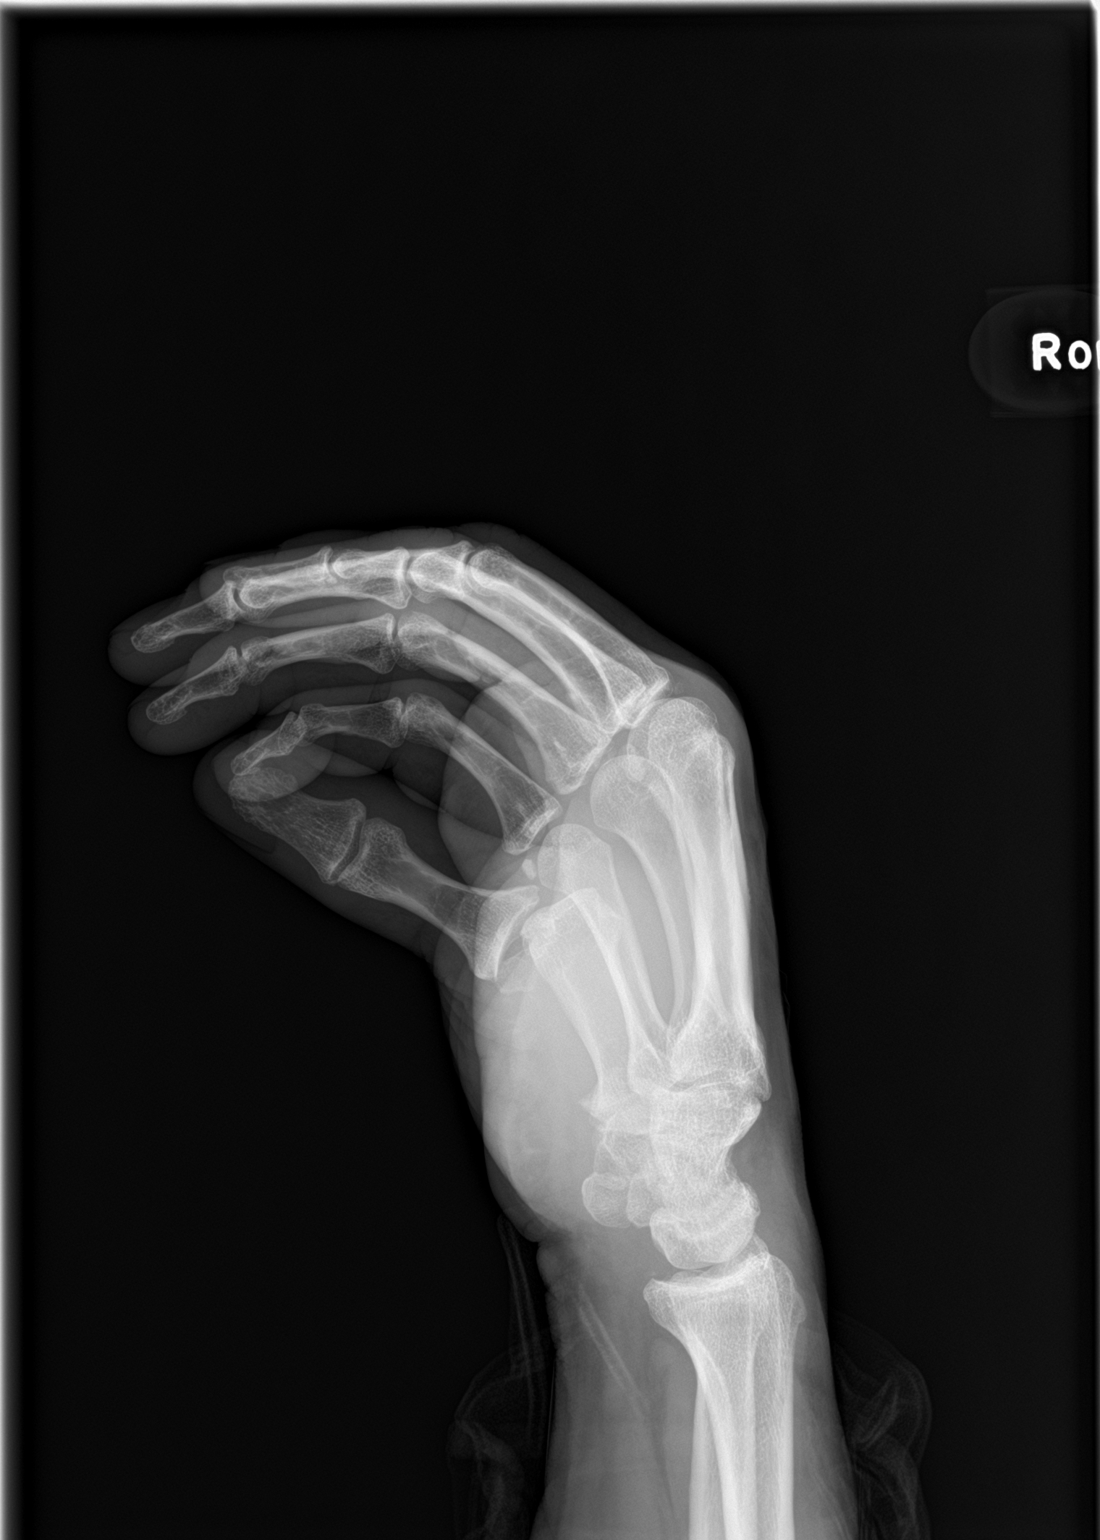

[3 of 3 positions shown; findings below may reference images not displayed]

FINDINGS: No fracture or dislocation is seen.

The joint spaces are preserved.

Mild soft tissue swelling along the ventral aspect of the hand.

No radiopaque foreign body is seen.
IMPRESSION: No fracture, dislocation, or radiopaque foreign body is seen.

## 2022-02-17 ENCOUNTER — Other Ambulatory Visit: Payer: Self-pay | Admitting: Internal Medicine

## 2022-02-18 LAB — LIPID PANEL
Cholesterol: 177 mg/dL (ref <200)
HDL: 37 mg/dL — ABNORMAL LOW (ref >=40)
LDL Cholesterol (Calc): 118 mg/dL (calc) — ABNORMAL HIGH
Non-HDL Cholesterol (Calc): 140 mg/dL (calc) — ABNORMAL HIGH (ref <130)
Total CHOL/HDL Ratio: 4.8 (calc) (ref <5.0)
Triglycerides: 111 mg/dL (ref <150)

## 2022-02-18 LAB — COMPLETE METABOLIC PANEL WITH GFR
AG Ratio: 1.5 (calc) (ref 1.0–2.5)
ALT: 16 U/L (ref 9–46)
AST: 18 U/L (ref 10–35)
Albumin: 4.2 g/dL (ref 3.6–5.1)
Alkaline phosphatase (APISO): 93 U/L (ref 35–144)
BUN: 17 mg/dL (ref 7–25)
CO2: 23 mmol/L (ref 20–32)
Calcium: 9.4 mg/dL (ref 8.6–10.3)
Chloride: 106 mmol/L (ref 98–110)
Creat: 0.9 mg/dL (ref 0.70–1.35)
Globulin: 2.8 g/dL (calc) (ref 1.9–3.7)
Glucose, Bld: 92 mg/dL (ref 65–99)
Potassium: 4.4 mmol/L (ref 3.5–5.3)
Sodium: 139 mmol/L (ref 135–146)
Total Bilirubin: 0.6 mg/dL (ref 0.2–1.2)
Total Protein: 7 g/dL (ref 6.1–8.1)
eGFR: 96 mL/min/{1.73_m2} (ref >=60)

## 2022-02-18 LAB — CBC
HCT: 42.1 % (ref 38.5–50.0)
Hemoglobin: 14.7 g/dL (ref 13.2–17.1)
MCH: 32.7 pg (ref 27.0–33.0)
MCHC: 34.9 g/dL (ref 32.0–36.0)
MCV: 93.8 fL (ref 80.0–100.0)
MPV: 9.7 fL (ref 7.5–12.5)
Platelets: 314 10*3/uL (ref 140–400)
RBC: 4.49 10*6/uL (ref 4.20–5.80)
RDW: 12.8 % (ref 11.0–15.0)
WBC: 5.1 10*3/uL (ref 3.8–10.8)

## 2022-02-18 LAB — VITAMIN D 25 HYDROXY (VIT D DEFICIENCY, FRACTURES): Vit D, 25-Hydroxy: 9 ng/mL — ABNORMAL LOW (ref 30–100)

## 2022-02-18 LAB — PSA: PSA: 1.98 ng/mL (ref <=4.00)

## 2022-08-15 ENCOUNTER — Encounter (HOSPITAL_COMMUNITY): Payer: Self-pay

## 2022-08-15 ENCOUNTER — Ambulatory Visit (INDEPENDENT_AMBULATORY_CARE_PROVIDER_SITE_OTHER): Payer: 59

## 2022-08-15 ENCOUNTER — Ambulatory Visit (HOSPITAL_COMMUNITY)
Admission: EM | Admit: 2022-08-15 | Discharge: 2022-08-15 | Disposition: A | Discharge status: Home / Self Care | Payer: 59 | Attending: Family Medicine | Admitting: Family Medicine

## 2022-08-15 DIAGNOSIS — M25512 Pain in left shoulder: Secondary | ICD-10-CM

## 2022-08-15 DIAGNOSIS — M25511 Pain in right shoulder: Secondary | ICD-10-CM | POA: Diagnosis not present

## 2022-08-15 DIAGNOSIS — G8929 Other chronic pain: Secondary | ICD-10-CM

## 2022-08-15 DIAGNOSIS — R9389 Abnormal findings on diagnostic imaging of other specified body structures: Secondary | ICD-10-CM

## 2022-08-15 MED ORDER — KETOROLAC TROMETHAMINE 30 MG/ML IJ SOLN
INTRAMUSCULAR | Status: AC
  Filled 2022-08-15: qty 1

## 2022-08-15 MED ORDER — KETOROLAC TROMETHAMINE 30 MG/ML IJ SOLN
30.0000 mg | Freq: Once | INTRAMUSCULAR | Status: AC
  Administered 2022-08-15: 30 mg via INTRAMUSCULAR

## 2022-08-15 MED ORDER — IBUPROFEN 800 MG PO TABS: 800.0000 mg | ORAL_TABLET | Freq: Three times a day (TID) | ORAL | 0 refills | Status: DC | PRN
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
## 2022-08-15 NOTE — Discharge Instructions (Addendum)
You have been given a shot of Toradol 30 mg today.  Take ibuprofen 800 mg--1 tab every 8 hours as needed for pain.  Your chest x-ray does not show the opacity/abnormality that was seen last year when we did your shoulder x-rays.  That is by my review.  The radiologist will read it also and if there is anything they see that I did not, we will call you.

## 2022-08-15 NOTE — ED Triage Notes (Signed)
Patient c/o right shoulder pain x 2 weeks. Patient states that he has had rotator cuff injury several years ago and has had issues with the same shoulder.  Patient is refusing an x-ray at this time.  Patient states he has not taken any medicatins for his pain.

## 2023-04-01 ENCOUNTER — Encounter (HOSPITAL_COMMUNITY): Payer: Self-pay

## 2023-04-01 ENCOUNTER — Ambulatory Visit (HOSPITAL_COMMUNITY)
Admission: EM | Admit: 2023-04-01 | Discharge: 2023-04-01 | Disposition: A | Discharge status: Home / Self Care | Payer: 59 | Attending: Family Medicine | Admitting: Family Medicine

## 2023-04-01 DIAGNOSIS — M25562 Pain in left knee: Secondary | ICD-10-CM | POA: Diagnosis not present

## 2023-04-01 MED ORDER — KETOROLAC TROMETHAMINE 30 MG/ML IJ SOLN
INTRAMUSCULAR | Status: AC
  Filled 2023-04-01: qty 1

## 2023-04-01 MED ORDER — IBUPROFEN 800 MG PO TABS: 800.0000 mg | ORAL_TABLET | Freq: Three times a day (TID) | ORAL | 0 refills | Status: AC | PRN

## 2023-04-01 MED ORDER — KETOROLAC TROMETHAMINE 30 MG/ML IJ SOLN
30.0000 mg | Freq: Once | INTRAMUSCULAR | Status: AC
  Administered 2023-04-01: 30 mg via INTRAMUSCULAR

## 2023-04-01 NOTE — Discharge Instructions (Signed)
You have been given a shot of Toradol 30 mg today.  Take ibuprofen 800 mg--1 tab every 8 hours as needed for pain.  I am glad you are going to get to follow-up with your primary care provider about this issue
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
## 2023-04-01 NOTE — ED Triage Notes (Signed)
Patient c/o left knee pain and swelling x 2 days.  Patient states he is out of Gabapentin 300 mg.

## 2023-09-20 ENCOUNTER — Ambulatory Visit (HOSPITAL_COMMUNITY): Admission: EM | Admit: 2023-09-20 | Discharge: 2023-09-20 | Disposition: A | Discharge status: Home / Self Care

## 2023-09-20 ENCOUNTER — Encounter (HOSPITAL_COMMUNITY): Payer: Self-pay | Admitting: *Deleted

## 2023-09-20 DIAGNOSIS — M65341 Trigger finger, right ring finger: Secondary | ICD-10-CM

## 2023-09-20 DIAGNOSIS — M20001 Unspecified deformity of right finger(s): Secondary | ICD-10-CM

## 2023-09-20 NOTE — ED Triage Notes (Signed)
 C/O right ring finger DIP joint getting stuck repeatedly in position of flexion, occurring occasionally for past year; Also c/o enlarged right PIP thumb joint for a long while.
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
## 2023-09-20 NOTE — Discharge Instructions (Addendum)
 The right ring finger symptoms are consistent with trigger finger.  This can be treated with conservative management including anti-inflammatories.  It can also be treated with an injection into the tendon sheath by an orthopedist or in some situations a tendon release.  Recommend going to [facility] if the symptoms become more persistent  There is a deformity of the right thumb at the base that is chronic in nature.  This could potentially be evaluated by a hand surgeon to see if any treatment options would be available.  After discussion, we decided to monitor this and if it worsens you can return for an x-ray or follow-up with the hand surgeon.

## 2023-11-22 ENCOUNTER — Encounter (HOSPITAL_COMMUNITY): Payer: Self-pay | Admitting: Emergency Medicine

## 2023-11-22 ENCOUNTER — Other Ambulatory Visit: Payer: Self-pay

## 2023-11-22 ENCOUNTER — Ambulatory Visit (HOSPITAL_COMMUNITY)
Admission: EM | Admit: 2023-11-22 | Discharge: 2023-11-22 | Disposition: A | Discharge status: Home / Self Care | Attending: Internal Medicine | Admitting: Internal Medicine

## 2023-11-22 DIAGNOSIS — S39012A Strain of muscle, fascia and tendon of lower back, initial encounter: Secondary | ICD-10-CM

## 2023-11-22 MED ORDER — METHYLPREDNISOLONE 4 MG PO TBPK: ORAL_TABLET | ORAL | 0 refills | Status: AC

## 2023-11-22 MED ORDER — KETOROLAC TROMETHAMINE 60 MG/2ML IM SOLN
60.0000 mg | Freq: Once | INTRAMUSCULAR | Status: AC
  Administered 2023-11-22: 60 mg via INTRAMUSCULAR

## 2023-11-22 MED ORDER — KETOROLAC TROMETHAMINE 60 MG/2ML IM SOLN
INTRAMUSCULAR | Status: AC
  Filled 2023-11-22: qty 2

## 2023-11-22 NOTE — Discharge Instructions (Signed)
 You have strained a muscle in your lower back. You will receive an injection of anti-inflammatory medication before leaving. I prescribed a steroid taper as well. Continue diclofenac and tizanidine  for symptom relief. Return to care if pain worsens or does not improve.
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
## 2023-11-22 NOTE — ED Triage Notes (Signed)
 Has a history of back pain.  This pain is worse than in the past.  This episode started Monday.  Pain is in left lower back.  Patient takes routine medication, nothing in addition.    On Saturday was breaking down some equipment that was heavy, but no pain at that time and no sure if contributed to painful episode currently

## 2024-04-03 ENCOUNTER — Ambulatory Visit (INDEPENDENT_AMBULATORY_CARE_PROVIDER_SITE_OTHER)

## 2024-04-03 ENCOUNTER — Ambulatory Visit: Admission: EM | Admit: 2024-04-03 | Discharge: 2024-04-03 | Disposition: A | Discharge status: Home / Self Care

## 2024-04-03 ENCOUNTER — Encounter: Payer: Self-pay | Admitting: Emergency Medicine

## 2024-04-03 DIAGNOSIS — R053 Chronic cough: Secondary | ICD-10-CM

## 2024-04-03 DIAGNOSIS — J40 Bronchitis, not specified as acute or chronic: Secondary | ICD-10-CM | POA: Diagnosis not present

## 2024-04-03 DIAGNOSIS — J329 Chronic sinusitis, unspecified: Secondary | ICD-10-CM | POA: Diagnosis not present

## 2024-04-03 MED ORDER — GUAIFENESIN ER 600 MG PO TB12: 600.0000 mg | ORAL_TABLET | Freq: Two times a day (BID) | ORAL | 0 refills | Status: AC

## 2024-04-03 MED ORDER — AZELASTINE HCL 0.1 % NA SOLN: 1.0000 | Freq: Two times a day (BID) | NASAL | 0 refills | Status: AC

## 2024-04-03 MED ORDER — PREDNISONE 50 MG PO TABS: ORAL_TABLET | ORAL | 0 refills | Status: AC

## 2024-04-03 MED ORDER — BENZONATATE 100 MG PO CAPS: 100.0000 mg | ORAL_CAPSULE | Freq: Three times a day (TID) | ORAL | 0 refills | Status: AC
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
## 2024-04-03 NOTE — ED Triage Notes (Signed)
 Pt reports productive cough and nasal congestion x2 weeks. Notes sternum pain with coughing fits and intermittent SOB. Taking alk setlzer cold/flu tabs and aleve  with little relief. No known fevers. No sick contacts.

## 2024-04-03 NOTE — Discharge Instructions (Signed)
 Preliminary review does not show pneumonia. Will send treatment for bronchitis and nasal congestion.
